# Patient Record
Sex: Male | Born: 1940
Health system: Southern US, Community
[De-identification: ages and names within clinical notes are randomized; demographics above are authoritative.]

## PROBLEM LIST (undated history)

## (undated) DIAGNOSIS — M1990 Unspecified osteoarthritis, unspecified site: Secondary | ICD-10-CM

## (undated) DIAGNOSIS — J189 Pneumonia, unspecified organism: Secondary | ICD-10-CM

## (undated) DIAGNOSIS — I10 Essential (primary) hypertension: Secondary | ICD-10-CM

## (undated) HISTORY — PX: COLONOSCOPY: SHX174

---

## 2015-09-09 DIAGNOSIS — I10 Essential (primary) hypertension: Secondary | ICD-10-CM | POA: Diagnosis not present

## 2015-09-09 DIAGNOSIS — N183 Chronic kidney disease, stage 3 (moderate): Secondary | ICD-10-CM | POA: Diagnosis not present

## 2015-09-17 DIAGNOSIS — I10 Essential (primary) hypertension: Secondary | ICD-10-CM | POA: Diagnosis not present

## 2015-09-17 DIAGNOSIS — N183 Chronic kidney disease, stage 3 (moderate): Secondary | ICD-10-CM | POA: Diagnosis not present

## 2015-10-16 DIAGNOSIS — N183 Chronic kidney disease, stage 3 (moderate): Secondary | ICD-10-CM | POA: Diagnosis not present

## 2015-10-16 DIAGNOSIS — I10 Essential (primary) hypertension: Secondary | ICD-10-CM | POA: Diagnosis not present

## 2015-10-28 DIAGNOSIS — I119 Hypertensive heart disease without heart failure: Secondary | ICD-10-CM | POA: Diagnosis not present

## 2015-10-28 DIAGNOSIS — E785 Hyperlipidemia, unspecified: Secondary | ICD-10-CM | POA: Diagnosis not present

## 2015-10-28 DIAGNOSIS — Z720 Tobacco use: Secondary | ICD-10-CM | POA: Diagnosis not present

## 2015-10-28 DIAGNOSIS — I739 Peripheral vascular disease, unspecified: Secondary | ICD-10-CM | POA: Diagnosis not present

## 2015-10-28 DIAGNOSIS — E559 Vitamin D deficiency, unspecified: Secondary | ICD-10-CM | POA: Diagnosis not present

## 2015-10-28 DIAGNOSIS — I10 Essential (primary) hypertension: Secondary | ICD-10-CM | POA: Diagnosis not present

## 2015-10-28 DIAGNOSIS — R7301 Impaired fasting glucose: Secondary | ICD-10-CM | POA: Diagnosis not present

## 2015-10-28 DIAGNOSIS — N183 Chronic kidney disease, stage 3 (moderate): Secondary | ICD-10-CM | POA: Diagnosis not present

## 2015-11-13 DIAGNOSIS — I10 Essential (primary) hypertension: Secondary | ICD-10-CM | POA: Diagnosis not present

## 2015-11-13 DIAGNOSIS — N183 Chronic kidney disease, stage 3 (moderate): Secondary | ICD-10-CM | POA: Diagnosis not present

## 2015-11-21 DIAGNOSIS — H11442 Conjunctival cysts, left eye: Secondary | ICD-10-CM | POA: Diagnosis not present

## 2015-12-22 DIAGNOSIS — I10 Essential (primary) hypertension: Secondary | ICD-10-CM | POA: Diagnosis not present

## 2015-12-22 DIAGNOSIS — N183 Chronic kidney disease, stage 3 (moderate): Secondary | ICD-10-CM | POA: Diagnosis not present

## 2016-01-26 DIAGNOSIS — Z136 Encounter for screening for cardiovascular disorders: Secondary | ICD-10-CM | POA: Diagnosis not present

## 2016-01-26 DIAGNOSIS — I10 Essential (primary) hypertension: Secondary | ICD-10-CM | POA: Diagnosis not present

## 2016-01-26 DIAGNOSIS — Z0000 Encounter for general adult medical examination without abnormal findings: Secondary | ICD-10-CM | POA: Diagnosis not present

## 2016-01-26 DIAGNOSIS — I739 Peripheral vascular disease, unspecified: Secondary | ICD-10-CM | POA: Diagnosis not present

## 2016-01-26 DIAGNOSIS — R7303 Prediabetes: Secondary | ICD-10-CM | POA: Diagnosis not present

## 2016-01-26 DIAGNOSIS — E559 Vitamin D deficiency, unspecified: Secondary | ICD-10-CM | POA: Diagnosis not present

## 2016-01-26 DIAGNOSIS — E785 Hyperlipidemia, unspecified: Secondary | ICD-10-CM | POA: Diagnosis not present

## 2016-01-26 DIAGNOSIS — Z1383 Encounter for screening for respiratory disorder NEC: Secondary | ICD-10-CM | POA: Diagnosis not present

## 2016-01-26 DIAGNOSIS — Z720 Tobacco use: Secondary | ICD-10-CM | POA: Diagnosis not present

## 2016-01-26 DIAGNOSIS — I119 Hypertensive heart disease without heart failure: Secondary | ICD-10-CM | POA: Diagnosis not present

## 2016-01-26 DIAGNOSIS — Z131 Encounter for screening for diabetes mellitus: Secondary | ICD-10-CM | POA: Diagnosis not present

## 2016-01-26 DIAGNOSIS — Z01118 Encounter for examination of ears and hearing with other abnormal findings: Secondary | ICD-10-CM | POA: Diagnosis not present

## 2016-01-26 DIAGNOSIS — N183 Chronic kidney disease, stage 3 (moderate): Secondary | ICD-10-CM | POA: Diagnosis not present

## 2016-02-04 DIAGNOSIS — I10 Essential (primary) hypertension: Secondary | ICD-10-CM | POA: Diagnosis not present

## 2016-02-04 DIAGNOSIS — N183 Chronic kidney disease, stage 3 (moderate): Secondary | ICD-10-CM | POA: Diagnosis not present

## 2016-02-12 DIAGNOSIS — N183 Chronic kidney disease, stage 3 (moderate): Secondary | ICD-10-CM | POA: Diagnosis not present

## 2016-02-12 DIAGNOSIS — I10 Essential (primary) hypertension: Secondary | ICD-10-CM | POA: Diagnosis not present

## 2016-03-26 DIAGNOSIS — I10 Essential (primary) hypertension: Secondary | ICD-10-CM | POA: Diagnosis not present

## 2016-03-26 DIAGNOSIS — E559 Vitamin D deficiency, unspecified: Secondary | ICD-10-CM | POA: Diagnosis not present

## 2016-03-26 DIAGNOSIS — E785 Hyperlipidemia, unspecified: Secondary | ICD-10-CM | POA: Diagnosis not present

## 2016-03-26 DIAGNOSIS — I739 Peripheral vascular disease, unspecified: Secondary | ICD-10-CM | POA: Diagnosis not present

## 2016-03-26 DIAGNOSIS — R7303 Prediabetes: Secondary | ICD-10-CM | POA: Diagnosis not present

## 2016-03-26 DIAGNOSIS — N183 Chronic kidney disease, stage 3 (moderate): Secondary | ICD-10-CM | POA: Diagnosis not present

## 2016-03-26 DIAGNOSIS — R1013 Epigastric pain: Secondary | ICD-10-CM | POA: Diagnosis not present

## 2016-03-26 DIAGNOSIS — Z0000 Encounter for general adult medical examination without abnormal findings: Secondary | ICD-10-CM | POA: Diagnosis not present

## 2016-03-26 DIAGNOSIS — I119 Hypertensive heart disease without heart failure: Secondary | ICD-10-CM | POA: Diagnosis not present

## 2016-03-26 DIAGNOSIS — Z720 Tobacco use: Secondary | ICD-10-CM | POA: Diagnosis not present

## 2016-04-09 DIAGNOSIS — N183 Chronic kidney disease, stage 3 (moderate): Secondary | ICD-10-CM | POA: Diagnosis not present

## 2016-04-09 DIAGNOSIS — I10 Essential (primary) hypertension: Secondary | ICD-10-CM | POA: Diagnosis not present

## 2016-04-30 DIAGNOSIS — I119 Hypertensive heart disease without heart failure: Secondary | ICD-10-CM | POA: Diagnosis not present

## 2016-04-30 DIAGNOSIS — I10 Essential (primary) hypertension: Secondary | ICD-10-CM | POA: Diagnosis not present

## 2016-04-30 DIAGNOSIS — Z0000 Encounter for general adult medical examination without abnormal findings: Secondary | ICD-10-CM | POA: Diagnosis not present

## 2016-04-30 DIAGNOSIS — N183 Chronic kidney disease, stage 3 (moderate): Secondary | ICD-10-CM | POA: Diagnosis not present

## 2016-04-30 DIAGNOSIS — E785 Hyperlipidemia, unspecified: Secondary | ICD-10-CM | POA: Diagnosis not present

## 2016-04-30 DIAGNOSIS — I739 Peripheral vascular disease, unspecified: Secondary | ICD-10-CM | POA: Diagnosis not present

## 2016-04-30 DIAGNOSIS — R7303 Prediabetes: Secondary | ICD-10-CM | POA: Diagnosis not present

## 2016-04-30 DIAGNOSIS — E559 Vitamin D deficiency, unspecified: Secondary | ICD-10-CM | POA: Diagnosis not present

## 2016-04-30 DIAGNOSIS — R1013 Epigastric pain: Secondary | ICD-10-CM | POA: Diagnosis not present

## 2016-04-30 DIAGNOSIS — Z720 Tobacco use: Secondary | ICD-10-CM | POA: Diagnosis not present

## 2016-05-07 DIAGNOSIS — I10 Essential (primary) hypertension: Secondary | ICD-10-CM | POA: Diagnosis not present

## 2016-05-07 DIAGNOSIS — N183 Chronic kidney disease, stage 3 (moderate): Secondary | ICD-10-CM | POA: Diagnosis not present

## 2016-05-17 DIAGNOSIS — N183 Chronic kidney disease, stage 3 (moderate): Secondary | ICD-10-CM | POA: Diagnosis not present

## 2016-05-17 DIAGNOSIS — I739 Peripheral vascular disease, unspecified: Secondary | ICD-10-CM | POA: Diagnosis not present

## 2016-05-17 DIAGNOSIS — Z720 Tobacco use: Secondary | ICD-10-CM | POA: Diagnosis not present

## 2016-05-17 DIAGNOSIS — E559 Vitamin D deficiency, unspecified: Secondary | ICD-10-CM | POA: Diagnosis not present

## 2016-05-17 DIAGNOSIS — I10 Essential (primary) hypertension: Secondary | ICD-10-CM | POA: Diagnosis not present

## 2016-05-17 DIAGNOSIS — E785 Hyperlipidemia, unspecified: Secondary | ICD-10-CM | POA: Diagnosis not present

## 2016-05-17 DIAGNOSIS — R1013 Epigastric pain: Secondary | ICD-10-CM | POA: Diagnosis not present

## 2016-05-17 DIAGNOSIS — I119 Hypertensive heart disease without heart failure: Secondary | ICD-10-CM | POA: Diagnosis not present

## 2016-05-17 DIAGNOSIS — R7303 Prediabetes: Secondary | ICD-10-CM | POA: Diagnosis not present

## 2016-06-04 DIAGNOSIS — I10 Essential (primary) hypertension: Secondary | ICD-10-CM | POA: Diagnosis not present

## 2016-06-04 DIAGNOSIS — N183 Chronic kidney disease, stage 3 (moderate): Secondary | ICD-10-CM | POA: Diagnosis not present

## 2016-06-09 DIAGNOSIS — N183 Chronic kidney disease, stage 3 (moderate): Secondary | ICD-10-CM | POA: Diagnosis not present

## 2016-06-09 DIAGNOSIS — H11442 Conjunctival cysts, left eye: Secondary | ICD-10-CM | POA: Diagnosis not present

## 2016-07-08 DIAGNOSIS — N183 Chronic kidney disease, stage 3 (moderate): Secondary | ICD-10-CM | POA: Diagnosis not present

## 2016-07-08 DIAGNOSIS — I10 Essential (primary) hypertension: Secondary | ICD-10-CM | POA: Diagnosis not present

## 2016-07-23 DIAGNOSIS — Z720 Tobacco use: Secondary | ICD-10-CM | POA: Diagnosis not present

## 2016-07-23 DIAGNOSIS — R1013 Epigastric pain: Secondary | ICD-10-CM | POA: Diagnosis not present

## 2016-07-23 DIAGNOSIS — I10 Essential (primary) hypertension: Secondary | ICD-10-CM | POA: Diagnosis not present

## 2016-07-23 DIAGNOSIS — I739 Peripheral vascular disease, unspecified: Secondary | ICD-10-CM | POA: Diagnosis not present

## 2016-07-23 DIAGNOSIS — I119 Hypertensive heart disease without heart failure: Secondary | ICD-10-CM | POA: Diagnosis not present

## 2016-07-23 DIAGNOSIS — N183 Chronic kidney disease, stage 3 (moderate): Secondary | ICD-10-CM | POA: Diagnosis not present

## 2016-07-23 DIAGNOSIS — E785 Hyperlipidemia, unspecified: Secondary | ICD-10-CM | POA: Diagnosis not present

## 2016-07-23 DIAGNOSIS — R7303 Prediabetes: Secondary | ICD-10-CM | POA: Diagnosis not present

## 2016-07-23 DIAGNOSIS — E559 Vitamin D deficiency, unspecified: Secondary | ICD-10-CM | POA: Diagnosis not present

## 2016-08-02 DIAGNOSIS — H11442 Conjunctival cysts, left eye: Secondary | ICD-10-CM | POA: Diagnosis not present

## 2016-08-18 DIAGNOSIS — R202 Paresthesia of skin: Secondary | ICD-10-CM | POA: Diagnosis not present

## 2016-08-31 DIAGNOSIS — G5621 Lesion of ulnar nerve, right upper limb: Secondary | ICD-10-CM | POA: Diagnosis not present

## 2016-09-15 DIAGNOSIS — M542 Cervicalgia: Secondary | ICD-10-CM | POA: Diagnosis not present

## 2016-09-15 DIAGNOSIS — M47812 Spondylosis without myelopathy or radiculopathy, cervical region: Secondary | ICD-10-CM | POA: Diagnosis not present

## 2016-09-15 DIAGNOSIS — M4312 Spondylolisthesis, cervical region: Secondary | ICD-10-CM | POA: Diagnosis not present

## 2016-09-23 DIAGNOSIS — I129 Hypertensive chronic kidney disease with stage 1 through stage 4 chronic kidney disease, or unspecified chronic kidney disease: Secondary | ICD-10-CM | POA: Diagnosis not present

## 2016-09-23 DIAGNOSIS — N183 Chronic kidney disease, stage 3 (moderate): Secondary | ICD-10-CM | POA: Diagnosis not present

## 2016-10-01 DIAGNOSIS — G5620 Lesion of ulnar nerve, unspecified upper limb: Secondary | ICD-10-CM | POA: Diagnosis not present

## 2016-10-01 DIAGNOSIS — M5030 Other cervical disc degeneration, unspecified cervical region: Secondary | ICD-10-CM | POA: Diagnosis not present

## 2016-10-18 DIAGNOSIS — I739 Peripheral vascular disease, unspecified: Secondary | ICD-10-CM | POA: Diagnosis not present

## 2016-10-18 DIAGNOSIS — N183 Chronic kidney disease, stage 3 (moderate): Secondary | ICD-10-CM | POA: Diagnosis not present

## 2016-10-18 DIAGNOSIS — I10 Essential (primary) hypertension: Secondary | ICD-10-CM | POA: Diagnosis not present

## 2016-10-18 DIAGNOSIS — I119 Hypertensive heart disease without heart failure: Secondary | ICD-10-CM | POA: Diagnosis not present

## 2016-10-18 DIAGNOSIS — Z720 Tobacco use: Secondary | ICD-10-CM | POA: Diagnosis not present

## 2016-10-18 DIAGNOSIS — Z0000 Encounter for general adult medical examination without abnormal findings: Secondary | ICD-10-CM | POA: Diagnosis not present

## 2016-10-18 DIAGNOSIS — Z01118 Encounter for examination of ears and hearing with other abnormal findings: Secondary | ICD-10-CM | POA: Diagnosis not present

## 2016-10-18 DIAGNOSIS — Z131 Encounter for screening for diabetes mellitus: Secondary | ICD-10-CM | POA: Diagnosis not present

## 2016-10-18 DIAGNOSIS — R1013 Epigastric pain: Secondary | ICD-10-CM | POA: Diagnosis not present

## 2016-10-18 DIAGNOSIS — E785 Hyperlipidemia, unspecified: Secondary | ICD-10-CM | POA: Diagnosis not present

## 2016-10-18 DIAGNOSIS — E559 Vitamin D deficiency, unspecified: Secondary | ICD-10-CM | POA: Diagnosis not present

## 2016-10-18 DIAGNOSIS — R7303 Prediabetes: Secondary | ICD-10-CM | POA: Diagnosis not present

## 2016-10-20 ENCOUNTER — Other Ambulatory Visit: Payer: Self-pay | Admitting: Nephrology

## 2016-10-20 DIAGNOSIS — N183 Chronic kidney disease, stage 3 unspecified: Secondary | ICD-10-CM

## 2016-10-27 ENCOUNTER — Other Ambulatory Visit: Payer: Self-pay

## 2016-12-31 DIAGNOSIS — G5620 Lesion of ulnar nerve, unspecified upper limb: Secondary | ICD-10-CM | POA: Diagnosis not present

## 2017-01-18 DIAGNOSIS — I739 Peripheral vascular disease, unspecified: Secondary | ICD-10-CM | POA: Diagnosis not present

## 2017-01-18 DIAGNOSIS — N183 Chronic kidney disease, stage 3 (moderate): Secondary | ICD-10-CM | POA: Diagnosis not present

## 2017-01-18 DIAGNOSIS — R1013 Epigastric pain: Secondary | ICD-10-CM | POA: Diagnosis not present

## 2017-01-18 DIAGNOSIS — D72819 Decreased white blood cell count, unspecified: Secondary | ICD-10-CM | POA: Diagnosis not present

## 2017-01-18 DIAGNOSIS — E785 Hyperlipidemia, unspecified: Secondary | ICD-10-CM | POA: Diagnosis not present

## 2017-01-18 DIAGNOSIS — E559 Vitamin D deficiency, unspecified: Secondary | ICD-10-CM | POA: Diagnosis not present

## 2017-01-18 DIAGNOSIS — Z720 Tobacco use: Secondary | ICD-10-CM | POA: Diagnosis not present

## 2017-01-18 DIAGNOSIS — R7303 Prediabetes: Secondary | ICD-10-CM | POA: Diagnosis not present

## 2017-01-18 DIAGNOSIS — I119 Hypertensive heart disease without heart failure: Secondary | ICD-10-CM | POA: Diagnosis not present

## 2017-01-18 DIAGNOSIS — I10 Essential (primary) hypertension: Secondary | ICD-10-CM | POA: Diagnosis not present

## 2017-07-06 DIAGNOSIS — I10 Essential (primary) hypertension: Secondary | ICD-10-CM | POA: Diagnosis not present

## 2017-07-06 DIAGNOSIS — I739 Peripheral vascular disease, unspecified: Secondary | ICD-10-CM | POA: Diagnosis not present

## 2017-07-20 DIAGNOSIS — I10 Essential (primary) hypertension: Secondary | ICD-10-CM | POA: Diagnosis not present

## 2017-07-20 DIAGNOSIS — I119 Hypertensive heart disease without heart failure: Secondary | ICD-10-CM | POA: Diagnosis not present

## 2017-08-30 DIAGNOSIS — Z720 Tobacco use: Secondary | ICD-10-CM | POA: Diagnosis not present

## 2017-08-30 DIAGNOSIS — R1013 Epigastric pain: Secondary | ICD-10-CM | POA: Diagnosis not present

## 2017-08-30 DIAGNOSIS — E559 Vitamin D deficiency, unspecified: Secondary | ICD-10-CM | POA: Diagnosis not present

## 2017-08-30 DIAGNOSIS — Z0000 Encounter for general adult medical examination without abnormal findings: Secondary | ICD-10-CM | POA: Diagnosis not present

## 2017-08-30 DIAGNOSIS — D72819 Decreased white blood cell count, unspecified: Secondary | ICD-10-CM | POA: Diagnosis not present

## 2017-08-30 DIAGNOSIS — E785 Hyperlipidemia, unspecified: Secondary | ICD-10-CM | POA: Diagnosis not present

## 2017-08-30 DIAGNOSIS — I119 Hypertensive heart disease without heart failure: Secondary | ICD-10-CM | POA: Diagnosis not present

## 2017-08-30 DIAGNOSIS — R7303 Prediabetes: Secondary | ICD-10-CM | POA: Diagnosis not present

## 2017-08-30 DIAGNOSIS — N183 Chronic kidney disease, stage 3 (moderate): Secondary | ICD-10-CM | POA: Diagnosis not present

## 2017-08-30 DIAGNOSIS — I10 Essential (primary) hypertension: Secondary | ICD-10-CM | POA: Diagnosis not present

## 2017-08-30 DIAGNOSIS — M25551 Pain in right hip: Secondary | ICD-10-CM | POA: Diagnosis not present

## 2017-08-30 DIAGNOSIS — I739 Peripheral vascular disease, unspecified: Secondary | ICD-10-CM | POA: Diagnosis not present

## 2017-08-31 DIAGNOSIS — N289 Disorder of kidney and ureter, unspecified: Secondary | ICD-10-CM | POA: Diagnosis not present

## 2017-08-31 DIAGNOSIS — I10 Essential (primary) hypertension: Secondary | ICD-10-CM | POA: Diagnosis not present

## 2017-09-14 DIAGNOSIS — I10 Essential (primary) hypertension: Secondary | ICD-10-CM | POA: Diagnosis not present

## 2017-09-14 DIAGNOSIS — E785 Hyperlipidemia, unspecified: Secondary | ICD-10-CM | POA: Diagnosis not present

## 2017-10-21 DIAGNOSIS — D72819 Decreased white blood cell count, unspecified: Secondary | ICD-10-CM | POA: Diagnosis not present

## 2017-10-21 DIAGNOSIS — Z0000 Encounter for general adult medical examination without abnormal findings: Secondary | ICD-10-CM | POA: Diagnosis not present

## 2017-10-21 DIAGNOSIS — Z01118 Encounter for examination of ears and hearing with other abnormal findings: Secondary | ICD-10-CM | POA: Diagnosis not present

## 2017-10-21 DIAGNOSIS — N183 Chronic kidney disease, stage 3 (moderate): Secondary | ICD-10-CM | POA: Diagnosis not present

## 2017-10-21 DIAGNOSIS — Z131 Encounter for screening for diabetes mellitus: Secondary | ICD-10-CM | POA: Diagnosis not present

## 2017-10-21 DIAGNOSIS — E559 Vitamin D deficiency, unspecified: Secondary | ICD-10-CM | POA: Diagnosis not present

## 2017-10-21 DIAGNOSIS — I739 Peripheral vascular disease, unspecified: Secondary | ICD-10-CM | POA: Diagnosis not present

## 2017-10-21 DIAGNOSIS — I119 Hypertensive heart disease without heart failure: Secondary | ICD-10-CM | POA: Diagnosis not present

## 2017-10-21 DIAGNOSIS — I10 Essential (primary) hypertension: Secondary | ICD-10-CM | POA: Diagnosis not present

## 2017-10-21 DIAGNOSIS — Z720 Tobacco use: Secondary | ICD-10-CM | POA: Diagnosis not present

## 2017-10-21 DIAGNOSIS — R1013 Epigastric pain: Secondary | ICD-10-CM | POA: Diagnosis not present

## 2017-10-21 DIAGNOSIS — R7303 Prediabetes: Secondary | ICD-10-CM | POA: Diagnosis not present

## 2017-10-21 DIAGNOSIS — E785 Hyperlipidemia, unspecified: Secondary | ICD-10-CM | POA: Diagnosis not present

## 2017-10-26 DIAGNOSIS — E785 Hyperlipidemia, unspecified: Secondary | ICD-10-CM | POA: Diagnosis not present

## 2017-10-26 DIAGNOSIS — I10 Essential (primary) hypertension: Secondary | ICD-10-CM | POA: Diagnosis not present

## 2017-11-30 DIAGNOSIS — I10 Essential (primary) hypertension: Secondary | ICD-10-CM | POA: Diagnosis not present

## 2017-11-30 DIAGNOSIS — E785 Hyperlipidemia, unspecified: Secondary | ICD-10-CM | POA: Diagnosis not present

## 2017-12-01 DIAGNOSIS — D72819 Decreased white blood cell count, unspecified: Secondary | ICD-10-CM | POA: Diagnosis not present

## 2017-12-01 DIAGNOSIS — M7918 Myalgia, other site: Secondary | ICD-10-CM | POA: Diagnosis not present

## 2017-12-01 DIAGNOSIS — N183 Chronic kidney disease, stage 3 (moderate): Secondary | ICD-10-CM | POA: Diagnosis not present

## 2017-12-01 DIAGNOSIS — I119 Hypertensive heart disease without heart failure: Secondary | ICD-10-CM | POA: Diagnosis not present

## 2017-12-01 DIAGNOSIS — I10 Essential (primary) hypertension: Secondary | ICD-10-CM | POA: Diagnosis not present

## 2017-12-01 DIAGNOSIS — E785 Hyperlipidemia, unspecified: Secondary | ICD-10-CM | POA: Diagnosis not present

## 2017-12-01 DIAGNOSIS — I739 Peripheral vascular disease, unspecified: Secondary | ICD-10-CM | POA: Diagnosis not present

## 2017-12-01 DIAGNOSIS — Z720 Tobacco use: Secondary | ICD-10-CM | POA: Diagnosis not present

## 2017-12-01 DIAGNOSIS — R1013 Epigastric pain: Secondary | ICD-10-CM | POA: Diagnosis not present

## 2017-12-01 DIAGNOSIS — E559 Vitamin D deficiency, unspecified: Secondary | ICD-10-CM | POA: Diagnosis not present

## 2017-12-01 DIAGNOSIS — R7303 Prediabetes: Secondary | ICD-10-CM | POA: Diagnosis not present

## 2017-12-19 DIAGNOSIS — N183 Chronic kidney disease, stage 3 (moderate): Secondary | ICD-10-CM | POA: Diagnosis not present

## 2017-12-19 DIAGNOSIS — I129 Hypertensive chronic kidney disease with stage 1 through stage 4 chronic kidney disease, or unspecified chronic kidney disease: Secondary | ICD-10-CM | POA: Diagnosis not present

## 2017-12-26 DIAGNOSIS — I739 Peripheral vascular disease, unspecified: Secondary | ICD-10-CM | POA: Diagnosis not present

## 2017-12-26 DIAGNOSIS — I10 Essential (primary) hypertension: Secondary | ICD-10-CM | POA: Diagnosis not present

## 2017-12-26 DIAGNOSIS — I119 Hypertensive heart disease without heart failure: Secondary | ICD-10-CM | POA: Diagnosis not present

## 2017-12-26 DIAGNOSIS — R1013 Epigastric pain: Secondary | ICD-10-CM | POA: Diagnosis not present

## 2017-12-26 DIAGNOSIS — E559 Vitamin D deficiency, unspecified: Secondary | ICD-10-CM | POA: Diagnosis not present

## 2017-12-26 DIAGNOSIS — M5417 Radiculopathy, lumbosacral region: Secondary | ICD-10-CM | POA: Diagnosis not present

## 2017-12-26 DIAGNOSIS — Z720 Tobacco use: Secondary | ICD-10-CM | POA: Diagnosis not present

## 2017-12-26 DIAGNOSIS — D72819 Decreased white blood cell count, unspecified: Secondary | ICD-10-CM | POA: Diagnosis not present

## 2017-12-26 DIAGNOSIS — N183 Chronic kidney disease, stage 3 (moderate): Secondary | ICD-10-CM | POA: Diagnosis not present

## 2017-12-26 DIAGNOSIS — R7303 Prediabetes: Secondary | ICD-10-CM | POA: Diagnosis not present

## 2017-12-26 DIAGNOSIS — E785 Hyperlipidemia, unspecified: Secondary | ICD-10-CM | POA: Diagnosis not present

## 2018-01-09 DIAGNOSIS — D72819 Decreased white blood cell count, unspecified: Secondary | ICD-10-CM | POA: Diagnosis not present

## 2018-01-09 DIAGNOSIS — Z720 Tobacco use: Secondary | ICD-10-CM | POA: Diagnosis not present

## 2018-01-09 DIAGNOSIS — M25551 Pain in right hip: Secondary | ICD-10-CM | POA: Diagnosis not present

## 2018-01-09 DIAGNOSIS — I739 Peripheral vascular disease, unspecified: Secondary | ICD-10-CM | POA: Diagnosis not present

## 2018-01-09 DIAGNOSIS — R7303 Prediabetes: Secondary | ICD-10-CM | POA: Diagnosis not present

## 2018-01-09 DIAGNOSIS — I10 Essential (primary) hypertension: Secondary | ICD-10-CM | POA: Diagnosis not present

## 2018-01-09 DIAGNOSIS — E785 Hyperlipidemia, unspecified: Secondary | ICD-10-CM | POA: Diagnosis not present

## 2018-01-09 DIAGNOSIS — I119 Hypertensive heart disease without heart failure: Secondary | ICD-10-CM | POA: Diagnosis not present

## 2018-01-09 DIAGNOSIS — E559 Vitamin D deficiency, unspecified: Secondary | ICD-10-CM | POA: Diagnosis not present

## 2018-01-09 DIAGNOSIS — M7918 Myalgia, other site: Secondary | ICD-10-CM | POA: Diagnosis not present

## 2018-01-09 DIAGNOSIS — N183 Chronic kidney disease, stage 3 (moderate): Secondary | ICD-10-CM | POA: Diagnosis not present

## 2018-01-09 DIAGNOSIS — R1013 Epigastric pain: Secondary | ICD-10-CM | POA: Diagnosis not present

## 2018-01-30 DIAGNOSIS — I10 Essential (primary) hypertension: Secondary | ICD-10-CM | POA: Diagnosis not present

## 2018-01-30 DIAGNOSIS — R1013 Epigastric pain: Secondary | ICD-10-CM | POA: Diagnosis not present

## 2018-01-30 DIAGNOSIS — I119 Hypertensive heart disease without heart failure: Secondary | ICD-10-CM | POA: Diagnosis not present

## 2018-01-30 DIAGNOSIS — E785 Hyperlipidemia, unspecified: Secondary | ICD-10-CM | POA: Diagnosis not present

## 2018-01-30 DIAGNOSIS — N183 Chronic kidney disease, stage 3 (moderate): Secondary | ICD-10-CM | POA: Diagnosis not present

## 2018-01-30 DIAGNOSIS — Z720 Tobacco use: Secondary | ICD-10-CM | POA: Diagnosis not present

## 2018-01-30 DIAGNOSIS — E559 Vitamin D deficiency, unspecified: Secondary | ICD-10-CM | POA: Diagnosis not present

## 2018-01-30 DIAGNOSIS — D72819 Decreased white blood cell count, unspecified: Secondary | ICD-10-CM | POA: Diagnosis not present

## 2018-01-30 DIAGNOSIS — I739 Peripheral vascular disease, unspecified: Secondary | ICD-10-CM | POA: Diagnosis not present

## 2018-01-30 DIAGNOSIS — M7918 Myalgia, other site: Secondary | ICD-10-CM | POA: Diagnosis not present

## 2018-01-30 DIAGNOSIS — M25551 Pain in right hip: Secondary | ICD-10-CM | POA: Diagnosis not present

## 2018-01-30 DIAGNOSIS — R7303 Prediabetes: Secondary | ICD-10-CM | POA: Diagnosis not present

## 2018-01-31 ENCOUNTER — Ambulatory Visit (INDEPENDENT_AMBULATORY_CARE_PROVIDER_SITE_OTHER): Payer: PPO | Admitting: Orthopaedic Surgery

## 2018-01-31 ENCOUNTER — Ambulatory Visit (INDEPENDENT_AMBULATORY_CARE_PROVIDER_SITE_OTHER): Payer: PPO

## 2018-01-31 ENCOUNTER — Encounter (INDEPENDENT_AMBULATORY_CARE_PROVIDER_SITE_OTHER): Payer: Self-pay | Admitting: Orthopedic Surgery

## 2018-01-31 VITALS — Ht 66.0 in | Wt 157.0 lb

## 2018-01-31 DIAGNOSIS — M1611 Unilateral primary osteoarthritis, right hip: Secondary | ICD-10-CM

## 2018-01-31 NOTE — Progress Notes (Signed)
   Office Visit Note   Patient: Jeffrey Shannon           Date of Birth: 1941/03/21           MRN: [id number] Visit Date: 01/31/2018              Requested by: Benito Mccreedy, MD [address] [place], [place] [address] PCP: Benito Mccreedy, MD   Assessment & Plan: Visit Diagnoses:  1. Unilateral primary osteoarthritis, right hip     Plan: Impression 77 year old male with end-stage right hip degenerative joint disease.  We discussed conservative versus surgical treatment options and their associated risks and benefits.  He would like to try a cortisone injection to see what kind of relief he gets.  He understands that given the severity of his degenerative disease he may not get the relief that he wants.  I would like to recheck him in 6 weeks to see how he responds to the injection.  Follow-Up Instructions: Return in about 6 weeks (around 03/14/2018).   Orders:  Orders Placed This Encounter  Procedures  . XR HIP UNILAT W OR W/O PELVIS 2-3 VIEWS RIGHT   No orders of the defined types were placed in this encounter.     Procedures: No procedures performed   Clinical Data: No additional findings.   Subjective: Chief Complaint  Patient presents with  . Right Leg - Pain  . Left Leg - Pain    Patient is a 77 year old gentleman 85-month history of right hip and groin pain and thigh pain.  Denies any injuries.  He continues to work as a Actor.  Denies a history of diabetes.  Pain is ambulation and activity and prolonged standing.   Review of Systems  Constitutional: Negative.   All other systems reviewed and are negative.    Objective: Vital Signs: Ht 5\' 6"  (1.676 m)   Wt 157 lb (71.2 kg)   BMI 25.34 kg/m   Physical Exam  Constitutional: He is oriented to person, place, and time. He appears well-developed and well-nourished.  HENT:  Head: Normocephalic and atraumatic.  Eyes: Pupils are equal, round, and reactive to light.  Neck: Neck supple.    Pulmonary/Chest: Effort normal.  Abdominal: Soft.  Musculoskeletal: Normal range of motion.  Neurological: He is alert and oriented to person, place, and time.  Skin: Skin is warm.  Psychiatric: He has a normal mood and affect. His behavior is normal. Judgment and thought content normal.  Nursing note and vitals reviewed.   Ortho Exam Patient walks with a limp.  He has positive FADIR and positive Stinchfield sign.  Negative sciatic tension signs.  Limited internal and external rotation. Specialty Comments:  No specialty comments available.  Imaging: Xr Hip Unilat W Or W/o Pelvis 2-3 Views Right  Result Date: 01/31/2018 Advanced right hip degenerative joint disease with bone-on-bone joint space narrowing    PMFS History: There are no active problems to display for this patient.  History reviewed. No pertinent past medical history.  History reviewed. No pertinent family history.  History reviewed. No pertinent surgical history. Social History   Occupational History  . Not on file  Tobacco Use  . Smoking status: Never Smoker  . Smokeless tobacco: Never Used  Substance and Sexual Activity  . Alcohol use: Not on file  . Drug use: Not on file  . Sexual activity: Not on file

## 2018-02-15 ENCOUNTER — Telehealth (INDEPENDENT_AMBULATORY_CARE_PROVIDER_SITE_OTHER): Payer: Self-pay | Admitting: Orthopaedic Surgery

## 2018-02-15 NOTE — Telephone Encounter (Signed)
See message can he come in sooner then sept 3rd

## 2018-02-15 NOTE — Telephone Encounter (Signed)
Patient called would like to know when he can come in for cortisone injection. Patient stated he is in a lot of pain and would like to come in soon if possible. Patient is sched for a 6 week follow up on the 3rd of September. Please call patient to discuss patient care.

## 2018-02-16 ENCOUNTER — Other Ambulatory Visit (INDEPENDENT_AMBULATORY_CARE_PROVIDER_SITE_OTHER): Payer: Self-pay

## 2018-02-16 DIAGNOSIS — M25551 Pain in right hip: Secondary | ICD-10-CM

## 2018-02-16 NOTE — Telephone Encounter (Signed)
Referral made for inj with Dr Ernestina Patches. Patient aware. Okay per Mendel Ryder.

## 2018-02-16 NOTE — Telephone Encounter (Signed)
Is this for the hip?  As long as it has been 3 months since previous injection it should be fine, but this needs to be coordinated with [place] team as he does the shots, not [place]

## 2018-03-02 ENCOUNTER — Ambulatory Visit (INDEPENDENT_AMBULATORY_CARE_PROVIDER_SITE_OTHER): Payer: Self-pay

## 2018-03-02 ENCOUNTER — Ambulatory Visit (INDEPENDENT_AMBULATORY_CARE_PROVIDER_SITE_OTHER): Payer: PPO | Admitting: Physical Medicine and Rehabilitation

## 2018-03-02 ENCOUNTER — Encounter (INDEPENDENT_AMBULATORY_CARE_PROVIDER_SITE_OTHER): Payer: Self-pay | Admitting: Physical Medicine and Rehabilitation

## 2018-03-02 DIAGNOSIS — M25551 Pain in right hip: Secondary | ICD-10-CM

## 2018-03-02 NOTE — Progress Notes (Signed)
 .  Numeric Pain Rating Scale and Functional Assessment Average Pain 8   In the last MONTH (on 0-10 scale) has pain interfered with the following?  1. General activity like being  able to carry out your everyday physical activities such as walking, climbing stairs, carrying groceries, or moving a chair?  Rating(5)    -Dye Allergies.  

## 2018-03-02 NOTE — Patient Instructions (Signed)

## 2018-03-02 NOTE — Progress Notes (Signed)
   Jeffrey Shannon - 77 y.o. male MRN [id number]  Date of birth: [birth year]  Office Visit Note: Visit Date: 03/02/2018 PCP: Benito Mccreedy, MD Referred by: Benito Mccreedy, MD  Subjective: Chief Complaint  Patient presents with  . Right Hip - Pain   HPI: Mr. Down is a 77 year old gentleman that comes in today at the request of Tawanna Cooler, PA-C for diagnostic and hopefully therapeutic right hip anesthetic arthrogram.  Patient has been having 3 months of severe right hip and groin pain worse with walking and bending.   ROS Otherwise per HPI.  Assessment & Plan: Visit Diagnoses:  1. Pain in right hip     Plan: Findings:  Diagnostic note for therapeutic anesthetic hip arthrogram on the right.  Patient did have relief during the anesthetic phase.    Meds & Orders: No orders of the defined types were placed in this encounter.   Orders Placed This Encounter  Procedures  . Large Joint Inj: R hip joint  . XR C-ARM NO REPORT    Follow-up: Return if symptoms worsen or fail to improve, for Dr. Erlinda Hong.   Procedures: Large Joint Inj: R hip joint on 03/02/2018 3:10 PM Indications: pain and diagnostic evaluation Details: 22 G needle, anterior approach  Arthrogram: Yes  Medications: 80 mg triamcinolone acetonide 40 MG/ML; 3 mL bupivacaine 0.5 % Outcome: tolerated well, no immediate complications  Arthrogram demonstrated excellent flow of contrast throughout the joint surface without extravasation or obvious defect.  The patient had relief of symptoms during the anesthetic phase of the injection.  Procedure, treatment alternatives, risks and benefits explained, specific risks discussed. Consent was given by the patient. Immediately prior to procedure a time out was called to verify the correct patient, procedure, equipment, support staff and site/side marked as required. Patient was prepped and draped in the usual sterile fashion.      No notes on file   Clinical History: No  specialty comments available.   He reports that he has never smoked. He has never used smokeless tobacco. No results for input(s): HGBA1C, LABURIC in the last 8760 hours.  Objective:  VS:  HT:    WT:   BMI:     BP:   HR: bpm  TEMP: ( )  RESP:  Physical Exam  Ortho Exam Imaging: No results found.  Past Medical/Family/Surgical/Social History: Medications & Allergies reviewed per EMR, new medications updated. There are no active problems to display for this patient.  History reviewed. No pertinent past medical history. History reviewed. No pertinent family history. History reviewed. No pertinent surgical history. Social History   Occupational History  . Not on file  Tobacco Use  . Smoking status: Never Smoker  . Smokeless tobacco: Never Used  Substance and Sexual Activity  . Alcohol use: Not on file  . Drug use: Not on file  . Sexual activity: Not on file

## 2018-03-14 ENCOUNTER — Ambulatory Visit (INDEPENDENT_AMBULATORY_CARE_PROVIDER_SITE_OTHER): Payer: PPO | Admitting: Orthopaedic Surgery

## 2018-03-14 ENCOUNTER — Encounter (INDEPENDENT_AMBULATORY_CARE_PROVIDER_SITE_OTHER): Payer: Self-pay | Admitting: Orthopaedic Surgery

## 2018-03-14 DIAGNOSIS — M1611 Unilateral primary osteoarthritis, right hip: Secondary | ICD-10-CM

## 2018-03-14 NOTE — Progress Notes (Signed)
   Office Visit Note   Patient: Jeffrey Shannon           Date of Birth: 1941/05/29           MRN: [id number] Visit Date: 03/14/2018              Requested by: Benito Mccreedy, MD [address] [place], [place] [address] PCP: Benito Mccreedy, MD   Assessment & Plan: Visit Diagnoses:  1. Unilateral primary osteoarthritis, right hip     Plan: Right hip pain secondary to severe right hip arthritis.  He had minimal relief from steroid injection.  With his advanced degeneration the next option would be hip replacement in order to achieve any meaningful pain relief.  Discussed this today however the patient is not interested at this time as the pain is tolerable enough.  He will follow-up as needed.  Follow-Up Instructions: Return if symptoms worsen or fail to improve.   Orders:  No orders of the defined types were placed in this encounter.  No orders of the defined types were placed in this encounter.     Procedures: No procedures performed   Clinical Data: No additional findings.   Subjective: Chief Complaint  Patient presents with  . Right Hip - Follow-up    HPI  77 year old male here for follow-up for right hip arthritis.  03/02/2018 he underwent a right hip intra-articular steroid injection.  He reports roughly 25% improvement in his pain.  Notably, he states he is no longer in constant pain and only experiences pain if he turns or pivots on his right leg.  He continues to have morning stiffness and pain.  Reports pain rating toward the groin and into the thigh.  He denies any numbness or tingling in the lower extremities.  Denies any back pain.  In addition to the recent steroid injection he was previously using hydrocodone and gabapentin for pain control which she has since discontinued.  No associated skin changes.  Review of Systems   Objective: Vital Signs: There were no vitals taken for this visit.  Physical Exam  GEN: Awake, alert, no acute  distress Pulmonary: Breathing unlabored  Ortho Exam  Right hip:  - Inspection: No gross deformity - Palpation: Mild tenderness anteriorly over the hip flexors.  No tenderness over the greater trochanter - ROM: Normal range of motion on Flexion, extension, abduction. decreased internal rotation with discomfort.  Normal external rotation - Strength: Normal strength. - Special Tests: Negative FABER positive FADIR.  Pain with Stinchfield  Specialty Comments:  No specialty comments available.  Imaging: No results found.   PMFS History: There are no active problems to display for this patient.  History reviewed. No pertinent past medical history.  History reviewed. No pertinent family history.  History reviewed. No pertinent surgical history. Social History   Occupational History  . Not on file  Tobacco Use  . Smoking status: Never Smoker  . Smokeless tobacco: Never Used  Substance and Sexual Activity  . Alcohol use: Not on file  . Drug use: Not on file  . Sexual activity: Not on file

## 2018-03-17 MED ORDER — BUPIVACAINE HCL 0.5 % IJ SOLN
3.0000 mL | INTRAMUSCULAR | Status: AC | PRN
  Administered 2018-03-02: 3 mL via INTRA_ARTICULAR

## 2018-03-17 MED ORDER — TRIAMCINOLONE ACETONIDE 40 MG/ML IJ SUSP
80.0000 mg | INTRAMUSCULAR | Status: AC | PRN
  Administered 2018-03-02: 80 mg via INTRA_ARTICULAR

## 2018-05-16 DIAGNOSIS — N183 Chronic kidney disease, stage 3 (moderate): Secondary | ICD-10-CM | POA: Diagnosis not present

## 2018-05-16 DIAGNOSIS — Z720 Tobacco use: Secondary | ICD-10-CM | POA: Diagnosis not present

## 2018-05-16 DIAGNOSIS — D72819 Decreased white blood cell count, unspecified: Secondary | ICD-10-CM | POA: Diagnosis not present

## 2018-05-16 DIAGNOSIS — I119 Hypertensive heart disease without heart failure: Secondary | ICD-10-CM | POA: Diagnosis not present

## 2018-05-16 DIAGNOSIS — R7303 Prediabetes: Secondary | ICD-10-CM | POA: Diagnosis not present

## 2018-05-16 DIAGNOSIS — E785 Hyperlipidemia, unspecified: Secondary | ICD-10-CM | POA: Diagnosis not present

## 2018-05-16 DIAGNOSIS — R1013 Epigastric pain: Secondary | ICD-10-CM | POA: Diagnosis not present

## 2018-05-16 DIAGNOSIS — Z23 Encounter for immunization: Secondary | ICD-10-CM | POA: Diagnosis not present

## 2018-05-16 DIAGNOSIS — I10 Essential (primary) hypertension: Secondary | ICD-10-CM | POA: Diagnosis not present

## 2018-05-16 DIAGNOSIS — I739 Peripheral vascular disease, unspecified: Secondary | ICD-10-CM | POA: Diagnosis not present

## 2018-05-16 DIAGNOSIS — Z131 Encounter for screening for diabetes mellitus: Secondary | ICD-10-CM | POA: Diagnosis not present

## 2018-05-16 DIAGNOSIS — E559 Vitamin D deficiency, unspecified: Secondary | ICD-10-CM | POA: Diagnosis not present

## 2018-07-31 DIAGNOSIS — R7303 Prediabetes: Secondary | ICD-10-CM | POA: Diagnosis not present

## 2018-07-31 DIAGNOSIS — D72819 Decreased white blood cell count, unspecified: Secondary | ICD-10-CM | POA: Diagnosis not present

## 2018-07-31 DIAGNOSIS — R1013 Epigastric pain: Secondary | ICD-10-CM | POA: Diagnosis not present

## 2018-07-31 DIAGNOSIS — Z1329 Encounter for screening for other suspected endocrine disorder: Secondary | ICD-10-CM | POA: Diagnosis not present

## 2018-07-31 DIAGNOSIS — Z720 Tobacco use: Secondary | ICD-10-CM | POA: Diagnosis not present

## 2018-07-31 DIAGNOSIS — E7800 Pure hypercholesterolemia, unspecified: Secondary | ICD-10-CM | POA: Diagnosis not present

## 2018-07-31 DIAGNOSIS — I10 Essential (primary) hypertension: Secondary | ICD-10-CM | POA: Diagnosis not present

## 2018-07-31 DIAGNOSIS — R062 Wheezing: Secondary | ICD-10-CM | POA: Diagnosis not present

## 2018-07-31 DIAGNOSIS — J189 Pneumonia, unspecified organism: Secondary | ICD-10-CM | POA: Diagnosis not present

## 2018-07-31 DIAGNOSIS — Z131 Encounter for screening for diabetes mellitus: Secondary | ICD-10-CM | POA: Diagnosis not present

## 2018-07-31 DIAGNOSIS — E559 Vitamin D deficiency, unspecified: Secondary | ICD-10-CM | POA: Diagnosis not present

## 2018-07-31 DIAGNOSIS — N183 Chronic kidney disease, stage 3 (moderate): Secondary | ICD-10-CM | POA: Diagnosis not present

## 2018-07-31 DIAGNOSIS — I119 Hypertensive heart disease without heart failure: Secondary | ICD-10-CM | POA: Diagnosis not present

## 2018-08-21 DIAGNOSIS — I129 Hypertensive chronic kidney disease with stage 1 through stage 4 chronic kidney disease, or unspecified chronic kidney disease: Secondary | ICD-10-CM | POA: Diagnosis not present

## 2018-08-21 DIAGNOSIS — N183 Chronic kidney disease, stage 3 (moderate): Secondary | ICD-10-CM | POA: Diagnosis not present

## 2018-08-22 ENCOUNTER — Ambulatory Visit (INDEPENDENT_AMBULATORY_CARE_PROVIDER_SITE_OTHER): Payer: PPO

## 2018-08-22 ENCOUNTER — Ambulatory Visit (INDEPENDENT_AMBULATORY_CARE_PROVIDER_SITE_OTHER): Payer: PPO | Admitting: Orthopaedic Surgery

## 2018-08-22 DIAGNOSIS — M1611 Unilateral primary osteoarthritis, right hip: Secondary | ICD-10-CM | POA: Diagnosis not present

## 2018-08-22 NOTE — Progress Notes (Signed)
Office Visit Note   Patient: Jeffrey Shannon           Date of Birth: Jan 24, 1941           MRN: [id number] Visit Date: 08/22/2018              Requested by: Jeffrey Mccreedy, MD [address] [place], [place] [address] PCP: Jeffrey Mccreedy, MD   Assessment & Plan: Visit Diagnoses:  1. Unilateral primary osteoarthritis, right hip     Plan: Impression is end-stage right hip degenerative joint disease now with significant clinical worsening and difficulty with ADLs.  At this point patient wishes to pursue a right total hip replacement after discussion of risks and benefits and rehab and recovery.  We will obtain PCP clearance first from Dr. Orma Shannon.  He denies a history of DVT or aspirin allergy.  All questions answered to his satisfaction.  Follow-Up Instructions: Return for 2 week post op visit .   Orders:  Orders Placed This Encounter  Procedures  . XR HIP UNILAT W OR W/O PELVIS 2-3 VIEWS RIGHT   No orders of the defined types were placed in this encounter.     Procedures: No procedures performed   Clinical Data: No additional findings.   Subjective: Chief Complaint  Patient presents with  . Right Hip - Follow-up    Jeffrey Shannon is a 78 year old gentleman who comes in for follow-up of his right hip degenerative joint disease.  He now has severe difficulty with ADLs and has recently gotten worsening pain to where he has trouble walking.  He denies any back pain.  He reports deep-seated right hip pain.   Review of Systems  Constitutional: Negative.   All other systems reviewed and are negative.    Objective: Vital Signs: There were no vitals taken for this visit.  Physical Exam Vitals signs and nursing note reviewed.  Constitutional:      Appearance: He is well-developed.  HENT:     Head: Normocephalic and atraumatic.  Eyes:     Pupils: Pupils are equal, round, and reactive to light.  Neck:     Musculoskeletal: Neck supple.  Pulmonary:   Effort: Pulmonary effort is normal.  Abdominal:     Palpations: Abdomen is soft.  Musculoskeletal: Normal range of motion.  Skin:    General: Skin is warm.  Neurological:     Mental Status: He is alert and oriented to person, place, and time.  Psychiatric:        Behavior: Behavior normal.        Thought Content: Thought content normal.        Judgment: Judgment normal.     Ortho Exam Right hip exam shows positive FADIR.  Positive logroll.  Positive Stinchfield sign. Specialty Comments:  No specialty comments available.  Imaging: Xr Hip Unilat W Or W/o Pelvis 2-3 Views Right  Result Date: 08/22/2018 Advanced right hip degenerative joint disease with bone-on-bone joint space narrowing    PMFS History: There are no active problems to display for this patient.  No past medical history on file.  No family history on file.  No past surgical history on file. Social History   Occupational History  . Not on file  Tobacco Use  . Smoking status: Never Smoker  . Smokeless tobacco: Never Used  Substance and Sexual Activity  . Alcohol use: Not on file  . Drug use: Not on file  . Sexual activity: Not on file

## 2018-08-30 DIAGNOSIS — E7800 Pure hypercholesterolemia, unspecified: Secondary | ICD-10-CM | POA: Diagnosis not present

## 2018-08-30 DIAGNOSIS — I10 Essential (primary) hypertension: Secondary | ICD-10-CM | POA: Diagnosis not present

## 2018-08-30 DIAGNOSIS — E559 Vitamin D deficiency, unspecified: Secondary | ICD-10-CM | POA: Diagnosis not present

## 2018-08-30 DIAGNOSIS — Z720 Tobacco use: Secondary | ICD-10-CM | POA: Diagnosis not present

## 2018-08-30 DIAGNOSIS — R7303 Prediabetes: Secondary | ICD-10-CM | POA: Diagnosis not present

## 2018-08-30 DIAGNOSIS — Z136 Encounter for screening for cardiovascular disorders: Secondary | ICD-10-CM | POA: Diagnosis not present

## 2018-08-30 DIAGNOSIS — R1013 Epigastric pain: Secondary | ICD-10-CM | POA: Diagnosis not present

## 2018-08-30 DIAGNOSIS — I119 Hypertensive heart disease without heart failure: Secondary | ICD-10-CM | POA: Diagnosis not present

## 2018-08-30 DIAGNOSIS — N183 Chronic kidney disease, stage 3 (moderate): Secondary | ICD-10-CM | POA: Diagnosis not present

## 2018-08-30 DIAGNOSIS — Z0001 Encounter for general adult medical examination with abnormal findings: Secondary | ICD-10-CM | POA: Diagnosis not present

## 2018-09-06 NOTE — Pre-Procedure Instructions (Signed)
[place]  09/06/2018      [id number] [place], [place]. [address] [place] [address] Phone: [phone] Fax: [phone]    Your procedure is scheduled on March 9th.  Report to [facility] Entrance "A" Admitting at 8:00 A.M.  Call this number if you have problems the morning of surgery:  [phone]   Remember:  Do not eat or drink after midnight.     Take these medicines the morning of surgery with A SIP OF WATER   Amlodipine (Norvasc)      Do not wear jewelry.  Do not wear lotions, powders, colognes, or deodorant.  Do not shave 48 hours prior to surgery.  Men may shave face and neck.  Do not bring valuables to the hospital.  [facility] is not responsible for any belongings or valuables.   [facility]- Preparing For Surgery  Before surgery, you can play an important role. Because skin is not sterile, your skin needs to be as free of germs as possible. You can reduce the number of germs on your skin by washing with CHG (chlorahexidine gluconate) Soap before surgery.  CHG is an antiseptic cleaner which kills germs and bonds with the skin to continue killing germs even after washing.    Oral Hygiene is also important to reduce your risk of infection.  Remember - BRUSH YOUR TEETH THE MORNING OF SURGERY WITH YOUR REGULAR TOOTHPASTE  Please do not use if you have an allergy to CHG or antibacterial soaps. If your skin becomes reddened/irritated stop using the CHG.  Do not shave (including legs and underarms) for at least 48 hours prior to first CHG shower. It is OK to shave your face.  Please follow these instructions carefully.   1. Shower the NIGHT BEFORE SURGERY and the MORNING OF SURGERY with CHG.   2. If you chose to wash your hair, wash your hair first as usual with your normal shampoo.  3. After you shampoo, rinse your hair and body thoroughly to remove the shampoo.  4. Use CHG as you would  any other liquid soap. You can apply CHG directly to the skin and wash gently with a scrungie or a clean washcloth.   5. Apply the CHG Soap to your body ONLY FROM THE NECK DOWN.  Do not use on open wounds or open sores. Avoid contact with your eyes, ears, mouth and genitals (private parts). Wash Face and genitals (private parts)  with your normal soap.  6. Wash thoroughly, paying special attention to the area where your surgery will be performed.  7. Thoroughly rinse your body with warm water from the neck down.  8. DO NOT shower/wash with your normal soap after using and rinsing off the CHG Soap.  9. Pat yourself dry with a CLEAN TOWEL.  10. Wear CLEAN PAJAMAS to bed the night before surgery, wear comfortable clothes the morning of surgery  11. Place CLEAN SHEETS on your bed the night of your first shower and DO NOT SLEEP WITH PETS.   Day of Surgery:  Do not apply any deodorants/lotions.  Please wear clean clothes to the hospital/surgery center.   Remember to brush your teeth WITH YOUR REGULAR TOOTHPASTE.   Contacts, dentures or bridgework may not be worn into surgery.  Leave your suitcase in the car.  After surgery it may be brought to your room.  For patients admitted to the hospital, discharge  time will be determined by your treatment team.  Patients discharged the day of surgery will not be allowed to drive home.    Please read over the following fact sheets that you were given. Coughing and Deep Breathing, MRSA Information and Surgical Site Infection Prevention

## 2018-09-07 ENCOUNTER — Other Ambulatory Visit: Payer: Self-pay

## 2018-09-07 ENCOUNTER — Encounter (HOSPITAL_COMMUNITY)
Admission: RE | Admit: 2018-09-07 | Discharge: 2018-09-07 | Disposition: A | Discharge status: Home / Self Care | Payer: PPO | Source: Ambulatory Visit | Attending: Orthopaedic Surgery | Admitting: Orthopaedic Surgery

## 2018-09-07 ENCOUNTER — Encounter (HOSPITAL_COMMUNITY): Payer: Self-pay

## 2018-09-07 DIAGNOSIS — Z01812 Encounter for preprocedural laboratory examination: Secondary | ICD-10-CM | POA: Diagnosis not present

## 2018-09-07 HISTORY — DX: Unspecified osteoarthritis, unspecified site: M19.90

## 2018-09-07 HISTORY — DX: Pneumonia, unspecified organism: J18.9

## 2018-09-07 HISTORY — DX: Essential (primary) hypertension: I10

## 2018-09-07 LAB — CBC WITH DIFFERENTIAL/PLATELET
Abs Immature Granulocytes: 0.01 10*3/uL (ref 0.00–0.07)
Basophils Absolute: 0 10*3/uL (ref 0.0–0.1)
Basophils Relative: 1 %
Eosinophils Absolute: 0.1 10*3/uL (ref 0.0–0.5)
Eosinophils Relative: 3 %
HCT: 46 % (ref 39.0–52.0)
Hemoglobin: 14.6 g/dL (ref 13.0–17.0)
Immature Granulocytes: 0 %
Lymphocytes Relative: 42 %
Lymphs Abs: 1.5 10*3/uL (ref 0.7–4.0)
MCH: 32.4 pg (ref 26.0–34.0)
MCHC: 31.7 g/dL (ref 30.0–36.0)
MCV: 102 fL — ABNORMAL HIGH (ref 80.0–100.0)
Monocytes Absolute: 0.6 10*3/uL (ref 0.1–1.0)
Monocytes Relative: 17 %
Neutro Abs: 1.3 10*3/uL — ABNORMAL LOW (ref 1.7–7.7)
Neutrophils Relative %: 37 %
Platelets: 192 10*3/uL (ref 150–400)
RBC: 4.51 MIL/uL (ref 4.22–5.81)
RDW: 12.3 % (ref 11.5–15.5)
WBC: 3.5 10*3/uL — ABNORMAL LOW (ref 4.0–10.5)
nRBC: 0 % (ref 0.0–0.2)

## 2018-09-07 LAB — COMPREHENSIVE METABOLIC PANEL
ALT: 15 U/L (ref 0–44)
AST: 22 U/L (ref 15–41)
Albumin: 3.9 g/dL (ref 3.5–5.0)
Alkaline Phosphatase: 65 U/L (ref 38–126)
Anion gap: 8 (ref 5–15)
BILIRUBIN TOTAL: 0.8 mg/dL (ref 0.3–1.2)
BUN: 17 mg/dL (ref 8–23)
CO2: 25 mmol/L (ref 22–32)
Calcium: 9.5 mg/dL (ref 8.9–10.3)
Chloride: 110 mmol/L (ref 98–111)
Creatinine, Ser: 1.33 mg/dL — ABNORMAL HIGH (ref 0.61–1.24)
GFR calc Af Amer: >60 mL/min (ref >60)
GFR calc non Af Amer: 52 mL/min — ABNORMAL LOW (ref >60)
Glucose, Bld: 91 mg/dL (ref 70–99)
Potassium: 4.3 mmol/L (ref 3.5–5.1)
Sodium: 143 mmol/L (ref 135–145)
TOTAL PROTEIN: 7.4 g/dL (ref 6.5–8.1)

## 2018-09-07 LAB — TYPE AND SCREEN
ABO/RH(D): A POS
Antibody Screen: NEGATIVE

## 2018-09-07 LAB — ABO/RH: ABO/RH(D): A POS

## 2018-09-07 LAB — PROTIME-INR
INR: 1.1 (ref 0.8–1.2)
Prothrombin Time: 13.9 seconds (ref 11.4–15.2)

## 2018-09-07 LAB — SURGICAL PCR SCREEN
MRSA, PCR: POSITIVE — AB
Staphylococcus aureus: POSITIVE — AB

## 2018-09-07 LAB — APTT: aPTT: 31 seconds (ref 24–36)

## 2018-09-07 NOTE — Progress Notes (Signed)
[facility]up[facility] [address] [facility] positive MRSA and Staph. Left message on pt's voicemail informing him of results and need to pick up Rx.

## 2018-09-07 NOTE — Progress Notes (Signed)
PCP - Dr. Iona Beard [place] Cardiologist - denies  Chest x-ray - 08/21/18 (requested from Dr. Matthias Hughs office) EKG - 08/21/18 (requested from Dr. Matthias Hughs office) Stress Test - denies ECHO - denies Cardiac Cath - denies  Sleep Study - N/A CPAP -   Fasting Blood Sugar - N/A Checks Blood Sugar _____ times a day  Blood Thinner Instructions: N/A Aspirin Instructions: N/A  Anesthesia review: yes - pt has seen a doctor at [facility], but he states he's not sure why he had to see them. Pt also states he had an EKG and CXR done on 09/19/18 at Dr. Matthias Hughs office and was diagnosed with pneumonia at that time. States he took antibiotics as prescribed, but he states he never felt bad.   Patient denies shortness of breath, fever, cough and chest pain at PAT appointment   Patient verbalized understanding of instructions that were given to them at the PAT appointment. Patient was also instructed that they will need to review over the PAT instructions again at home before surgery.

## 2018-09-07 NOTE — Pre-Procedure Instructions (Signed)
Jeffrey Shannon  09/07/2018    Your procedure is scheduled on Monday, September 18, 2018 at 10:00 AM.   Report to [facility] Entrance "A" Admitting Office at 8:00 AM.   Call this number if you have problems the morning of surgery: [phone]              Questions prior to day of surgery, please call [phone] between 8 & 4 PM.   Remember:  Do not eat or drink after midnight "Sunday, 09/17/18.  Take these medicines the morning of surgery with A SIP OF WATER: Amlodipine (Norvasc)  Do not use NSAIDS (Ibuprofen, Aleve, etc), Aspirin products (Goody's, BC Powders, etc) or Herbal medications 7 days prior to surgery.    Do not wear jewelry.  Do not wear lotions, powders, cologne or deodorant.  Men may shave face and neck.  Do not bring valuables to the hospital.  [facility] is not responsible for any belongings or valuables.  Contacts, dentures or bridgework may not be worn into surgery.  Leave your suitcase in the car.  After surgery it may be brought to your room.  For patients admitted to the hospital, discharge time will be determined by your treatment team.  [facility] - Preparing for Surgery  Before surgery, you can play an important role.  Because skin is not sterile, your skin needs to be as free of germs as possible.  You can reduce the number of germs on you skin by washing with CHG (chlorahexidine gluconate) soap before surgery.  CHG is an antiseptic cleaner which kills germs and bonds with the skin to continue killing germs even after washing.  Oral Hygiene is also important in reducing the risk of infection.  Remember to brush your teeth with your regular toothpaste the morning of surgery.  Please DO NOT use if you have an allergy to CHG or antibacterial soaps.  If your skin becomes reddened/irritated stop using the CHG and inform your nurse when you arrive at Short Stay.  Do not shave (including legs and underarms) for at least 48 hours prior to the first CHG shower.   You may shave your face.  Please follow these instructions carefully:   1.  Shower with CHG Soap the night before surgery and the morning of Surgery.  2.  If you choose to wash your hair, wash your hair first as usual with your normal shampoo.  3.  After you shampoo, rinse your hair and body thoroughly to remove the shampoo. 4.  Use CHG as you would any other liquid soap.  You can apply chg directly to the skin and wash gently with a      scrungie or washcloth.           5.  Apply the CHG Soap to your body ONLY FROM THE NECK DOWN.   Do not use on open wounds or open sores. Avoid contact with your eyes, ears, mouth and genitals (private parts).  Wash genitals (private parts) with your normal soap.  6.  Wash thoroughly, paying special attention to the area where your surgery will be performed.  7.  Thoroughly rinse your body with warm water from the neck down.  8.  DO NOT shower/wash with your normal soap after using and rinsing off the CHG Soap.  9.  Pat yourself dry with a clean towel.            10" .  Wear clean pajamas.  11.  Place clean sheets on your bed the night of your first shower and do not sleep with pets.  Day of Surgery  Shower as above. Do not apply any lotions/deodorants the morning of surgery.   Please wear clean clothes to the hospital. Remember to brush your teeth with toothpaste.   Please read over the fact sheets that you were given.

## 2018-09-08 NOTE — Anesthesia Preprocedure Evaluation (Addendum)
Anesthesia Evaluation  Patient identified by MRN, date of birth, ID band Patient awake    Reviewed: Allergy & Precautions, NPO status , Patient's Chart, lab work & pertinent test results  History of Anesthesia Complications Negative for: history of anesthetic complications  Airway Mallampati: I  TM Distance: >3 FB Neck ROM: Full    Dental  (+) Dental Advisory Given, Poor Dentition, Missing   Pulmonary former smoker (quit 2009),    breath sounds clear to auscultation       Cardiovascular hypertension, Pt. on medications (-) angina Rhythm:Regular Rate:Normal     Neuro/Psych negative neurological ROS     GI/Hepatic negative GI ROS, Neg liver ROS,   Endo/Other  negative endocrine ROS  Renal/GU Renal disease     Musculoskeletal  (+) Arthritis ,   Abdominal   Peds  Hematology plt 192k   Anesthesia Other Findings   Reproductive/Obstetrics                           Anesthesia Physical Anesthesia Plan  ASA: II  Anesthesia Plan: Spinal   Post-op Pain Management:    Induction: Intravenous  PONV Risk Score and Plan: 1 and Ondansetron and Treatment may vary due to age or medical condition  Airway Management Planned: Natural Airway and Simple Face Mask  Additional Equipment:   Intra-op Plan:   Post-operative Plan:   Informed Consent: I have reviewed the patients History and Physical, chart, labs and discussed the procedure including the risks, benefits and alternatives for the proposed anesthesia with the patient or authorized representative who has indicated his/her understanding and acceptance.     Dental advisory given  Plan Discussed with: CRNA and Surgeon  Anesthesia Plan Comments: (Seen by nephrology 08/21/18 for eval of CKD. Per notes, hip replacement was discussed, CKD deemed not progressive, advised recheck in 1 year.  Medical and cardiac clearance by PCP Dr. Vista Lawman on pt  chart.)     Anesthesia Quick Evaluation

## 2018-09-15 MED ORDER — VANCOMYCIN HCL IN DEXTROSE 1-5 GM/200ML-% IV SOLN
1000.0000 mg | INTRAVENOUS | Status: AC
  Administered 2018-09-18: 1000 mg via INTRAVENOUS
  Filled 2018-09-15: qty 200

## 2018-09-15 MED ORDER — CEFAZOLIN SODIUM-DEXTROSE 2-4 GM/100ML-% IV SOLN
2.0000 g | INTRAVENOUS | Status: AC
  Administered 2018-09-18: 2 g via INTRAVENOUS
  Filled 2018-09-15: qty 100

## 2018-09-15 MED ORDER — TRANEXAMIC ACID 1000 MG/10ML IV SOLN
2000.0000 mg | INTRAVENOUS | Status: AC
  Administered 2018-09-18: 2000 mg via TOPICAL
  Filled 2018-09-15: qty 20

## 2018-09-15 MED ORDER — TRANEXAMIC ACID-NACL 1000-0.7 MG/100ML-% IV SOLN
1000.0000 mg | INTRAVENOUS | Status: AC
  Administered 2018-09-18: 1000 mg via INTRAVENOUS
  Filled 2018-09-15: qty 100

## 2018-09-18 ENCOUNTER — Observation Stay (HOSPITAL_COMMUNITY): Payer: PPO

## 2018-09-18 ENCOUNTER — Encounter (HOSPITAL_COMMUNITY)
Admission: RE | Disposition: A | Discharge status: Home / Self Care | Payer: Self-pay | Source: Home / Self Care | Attending: Orthopaedic Surgery

## 2018-09-18 ENCOUNTER — Encounter (HOSPITAL_COMMUNITY): Payer: Self-pay

## 2018-09-18 ENCOUNTER — Other Ambulatory Visit: Payer: Self-pay

## 2018-09-18 ENCOUNTER — Ambulatory Visit (HOSPITAL_COMMUNITY): Payer: PPO

## 2018-09-18 ENCOUNTER — Ambulatory Visit (HOSPITAL_COMMUNITY): Payer: PPO | Admitting: Physician Assistant

## 2018-09-18 ENCOUNTER — Ambulatory Visit (HOSPITAL_COMMUNITY): Payer: PPO | Admitting: Anesthesiology

## 2018-09-18 ENCOUNTER — Observation Stay (HOSPITAL_COMMUNITY)
Admission: RE | Admit: 2018-09-18 | Discharge: 2018-09-19 | Disposition: A | Discharge status: Home / Self Care | Payer: PPO | Attending: Orthopaedic Surgery | Admitting: Orthopaedic Surgery

## 2018-09-18 DIAGNOSIS — Z96641 Presence of right artificial hip joint: Secondary | ICD-10-CM | POA: Diagnosis not present

## 2018-09-18 DIAGNOSIS — R2689 Other abnormalities of gait and mobility: Secondary | ICD-10-CM | POA: Insufficient documentation

## 2018-09-18 DIAGNOSIS — Z419 Encounter for procedure for purposes other than remedying health state, unspecified: Secondary | ICD-10-CM

## 2018-09-18 DIAGNOSIS — Z87891 Personal history of nicotine dependence: Secondary | ICD-10-CM | POA: Diagnosis not present

## 2018-09-18 DIAGNOSIS — Z79899 Other long term (current) drug therapy: Secondary | ICD-10-CM | POA: Insufficient documentation

## 2018-09-18 DIAGNOSIS — M6281 Muscle weakness (generalized): Secondary | ICD-10-CM | POA: Insufficient documentation

## 2018-09-18 DIAGNOSIS — I129 Hypertensive chronic kidney disease with stage 1 through stage 4 chronic kidney disease, or unspecified chronic kidney disease: Secondary | ICD-10-CM | POA: Diagnosis not present

## 2018-09-18 DIAGNOSIS — I10 Essential (primary) hypertension: Secondary | ICD-10-CM | POA: Insufficient documentation

## 2018-09-18 DIAGNOSIS — N189 Chronic kidney disease, unspecified: Secondary | ICD-10-CM | POA: Diagnosis not present

## 2018-09-18 DIAGNOSIS — Z471 Aftercare following joint replacement surgery: Secondary | ICD-10-CM | POA: Diagnosis not present

## 2018-09-18 DIAGNOSIS — Z96649 Presence of unspecified artificial hip joint: Secondary | ICD-10-CM

## 2018-09-18 DIAGNOSIS — M1611 Unilateral primary osteoarthritis, right hip: Principal | ICD-10-CM

## 2018-09-18 HISTORY — PX: TOTAL HIP ARTHROPLASTY: SHX124

## 2018-09-18 SURGERY — ARTHROPLASTY, HIP, TOTAL, ANTERIOR APPROACH
Anesthesia: Spinal | Site: Hip | Laterality: Right

## 2018-09-18 MED ORDER — SODIUM CHLORIDE 0.9 % IV SOLN
INTRAVENOUS | Status: DC | PRN
  Administered 2018-09-18: 25 ug/min via INTRAVENOUS

## 2018-09-18 MED ORDER — BUPIVACAINE IN DEXTROSE 0.75-8.25 % IT SOLN
INTRATHECAL | Status: DC | PRN
  Administered 2018-09-18: 15 mg via INTRATHECAL

## 2018-09-18 MED ORDER — GABAPENTIN 300 MG PO CAPS
300.0000 mg | ORAL_CAPSULE | Freq: Three times a day (TID) | ORAL | Status: DC
  Administered 2018-09-18 – 2018-09-19 (×3): 300 mg via ORAL
  Filled 2018-09-18 (×3): qty 1

## 2018-09-18 MED ORDER — METHOCARBAMOL 500 MG PO TABS: 500.0000 mg | ORAL_TABLET | Freq: Four times a day (QID) | ORAL | Status: DC | PRN

## 2018-09-18 MED ORDER — DEXAMETHASONE SODIUM PHOSPHATE 10 MG/ML IJ SOLN
10.0000 mg | Freq: Once | INTRAMUSCULAR | Status: AC
  Administered 2018-09-19: 10 mg via INTRAVENOUS
  Filled 2018-09-18: qty 1

## 2018-09-18 MED ORDER — METHOCARBAMOL 1000 MG/10ML IJ SOLN
500.0000 mg | Freq: Four times a day (QID) | INTRAVENOUS | Status: DC | PRN
  Filled 2018-09-18: qty 5

## 2018-09-18 MED ORDER — AMLODIPINE BESYLATE 10 MG PO TABS
10.0000 mg | ORAL_TABLET | Freq: Every day | ORAL | Status: DC
  Administered 2018-09-18 – 2018-09-19 (×2): 10 mg via ORAL
  Filled 2018-09-18 (×2): qty 1

## 2018-09-18 MED ORDER — CHLORHEXIDINE GLUCONATE 4 % EX LIQD: 60.0000 mL | Freq: Once | CUTANEOUS | Status: DC

## 2018-09-18 MED ORDER — OXYCODONE HCL ER 10 MG PO T12A
10.0000 mg | EXTENDED_RELEASE_TABLET | Freq: Two times a day (BID) | ORAL | Status: DC
  Administered 2018-09-18 – 2018-09-19 (×2): 10 mg via ORAL
  Filled 2018-09-18 (×2): qty 1

## 2018-09-18 MED ORDER — LACTATED RINGERS IV SOLN
INTRAVENOUS | Status: DC
  Administered 2018-09-18 (×2): via INTRAVENOUS

## 2018-09-18 MED ORDER — CEFAZOLIN SODIUM-DEXTROSE 2-4 GM/100ML-% IV SOLN
2.0000 g | Freq: Four times a day (QID) | INTRAVENOUS | Status: AC
  Administered 2018-09-18 – 2018-09-19 (×3): 2 g via INTRAVENOUS
  Filled 2018-09-18 (×3): qty 100

## 2018-09-18 MED ORDER — METOCLOPRAMIDE HCL 5 MG/ML IJ SOLN: 5.0000 mg | Freq: Three times a day (TID) | INTRAMUSCULAR | Status: DC | PRN

## 2018-09-18 MED ORDER — PROPOFOL 1000 MG/100ML IV EMUL
INTRAVENOUS | Status: AC
  Filled 2018-09-18: qty 100

## 2018-09-18 MED ORDER — MAGNESIUM CITRATE PO SOLN: 1.0000 | Freq: Once | ORAL | Status: DC | PRN

## 2018-09-18 MED ORDER — OXYCODONE HCL 5 MG PO TABS: 5.0000 mg | ORAL_TABLET | ORAL | Status: DC | PRN

## 2018-09-18 MED ORDER — PHENYLEPHRINE HCL 10 MG/ML IJ SOLN
INTRAMUSCULAR | Status: DC | PRN
  Administered 2018-09-18 (×4): 60 ug via INTRAVENOUS

## 2018-09-18 MED ORDER — GABAPENTIN 600 MG PO TABS
1200.0000 mg | ORAL_TABLET | Freq: Every day | ORAL | Status: DC
  Administered 2018-09-18: 1200 mg via ORAL
  Filled 2018-09-18: qty 2

## 2018-09-18 MED ORDER — PROMETHAZINE HCL 25 MG/ML IJ SOLN: 6.2500 mg | INTRAMUSCULAR | Status: DC | PRN

## 2018-09-18 MED ORDER — ACETAMINOPHEN 325 MG PO TABS: 325.0000 mg | ORAL_TABLET | Freq: Four times a day (QID) | ORAL | Status: DC | PRN

## 2018-09-18 MED ORDER — PROPOFOL 500 MG/50ML IV EMUL
INTRAVENOUS | Status: DC | PRN
  Administered 2018-09-18: 20 ug/kg/min via INTRAVENOUS

## 2018-09-18 MED ORDER — ASPIRIN EC 81 MG PO TBEC: 81.0000 mg | DELAYED_RELEASE_TABLET | Freq: Two times a day (BID) | ORAL | 0 refills | Status: AC

## 2018-09-18 MED ORDER — DIPHENHYDRAMINE HCL 50 MG/ML IJ SOLN
INTRAMUSCULAR | Status: DC | PRN
  Administered 2018-09-18: 12.5 mg via INTRAVENOUS

## 2018-09-18 MED ORDER — OXYCODONE HCL ER 10 MG PO T12A: 10.0000 mg | EXTENDED_RELEASE_TABLET | Freq: Two times a day (BID) | ORAL | 0 refills | Status: AC

## 2018-09-18 MED ORDER — MENTHOL 3 MG MT LOZG: 1.0000 | LOZENGE | OROMUCOSAL | Status: DC | PRN

## 2018-09-18 MED ORDER — ALUM & MAG HYDROXIDE-SIMETH 200-200-20 MG/5ML PO SUSP: 30.0000 mL | ORAL | Status: DC | PRN

## 2018-09-18 MED ORDER — KETOROLAC TROMETHAMINE 15 MG/ML IJ SOLN
30.0000 mg | Freq: Four times a day (QID) | INTRAMUSCULAR | Status: AC
  Administered 2018-09-18 – 2018-09-19 (×3): 30 mg via INTRAVENOUS
  Filled 2018-09-18 (×3): qty 2

## 2018-09-18 MED ORDER — SENNOSIDES-DOCUSATE SODIUM 8.6-50 MG PO TABS: 1.0000 | ORAL_TABLET | Freq: Every evening | ORAL | 1 refills | Status: AC | PRN

## 2018-09-18 MED ORDER — MIDAZOLAM HCL 2 MG/2ML IJ SOLN: 0.5000 mg | Freq: Once | INTRAMUSCULAR | Status: DC | PRN

## 2018-09-18 MED ORDER — ACETAMINOPHEN 500 MG PO TABS
1000.0000 mg | ORAL_TABLET | Freq: Four times a day (QID) | ORAL | Status: AC
  Administered 2018-09-18 – 2018-09-19 (×3): 1000 mg via ORAL
  Filled 2018-09-18 (×3): qty 2

## 2018-09-18 MED ORDER — METOCLOPRAMIDE HCL 5 MG PO TABS: 5.0000 mg | ORAL_TABLET | Freq: Three times a day (TID) | ORAL | Status: DC | PRN

## 2018-09-18 MED ORDER — 0.9 % SODIUM CHLORIDE (POUR BTL) OPTIME
TOPICAL | Status: DC | PRN
  Administered 2018-09-18: 1000 mL

## 2018-09-18 MED ORDER — LACTATED RINGERS IV SOLN: INTRAVENOUS | Status: DC

## 2018-09-18 MED ORDER — HYDRALAZINE HCL 20 MG/ML IJ SOLN
20.0000 mg | Freq: Once | INTRAMUSCULAR | Status: AC
  Administered 2018-09-18: 20 mg via INTRAVENOUS
  Filled 2018-09-18: qty 1

## 2018-09-18 MED ORDER — DIPHENHYDRAMINE HCL 12.5 MG/5ML PO ELIX: 25.0000 mg | ORAL_SOLUTION | ORAL | Status: DC | PRN

## 2018-09-18 MED ORDER — VANCOMYCIN HCL 1 G IV SOLR
INTRAVENOUS | Status: DC | PRN
  Administered 2018-09-18: 1000 mg via TOPICAL

## 2018-09-18 MED ORDER — LIDOCAINE 2% (20 MG/ML) 5 ML SYRINGE
INTRAMUSCULAR | Status: DC | PRN
  Administered 2018-09-18: 50 mg via INTRAVENOUS

## 2018-09-18 MED ORDER — MIDAZOLAM HCL 5 MG/5ML IJ SOLN
INTRAMUSCULAR | Status: DC | PRN
  Administered 2018-09-18: 1 mg via INTRAVENOUS

## 2018-09-18 MED ORDER — SORBITOL 70 % SOLN: 30.0000 mL | Freq: Every day | Status: DC | PRN

## 2018-09-18 MED ORDER — MIDAZOLAM HCL 2 MG/2ML IJ SOLN
INTRAMUSCULAR | Status: AC
  Filled 2018-09-18: qty 2

## 2018-09-18 MED ORDER — ONDANSETRON HCL 4 MG/2ML IJ SOLN: 4.0000 mg | Freq: Four times a day (QID) | INTRAMUSCULAR | Status: DC | PRN

## 2018-09-18 MED ORDER — DEXAMETHASONE SODIUM PHOSPHATE 10 MG/ML IJ SOLN
INTRAMUSCULAR | Status: DC | PRN
  Administered 2018-09-18: 4 mg via INTRAVENOUS

## 2018-09-18 MED ORDER — TRANEXAMIC ACID-NACL 1000-0.7 MG/100ML-% IV SOLN
1000.0000 mg | Freq: Once | INTRAVENOUS | Status: AC
  Administered 2018-09-18: 1000 mg via INTRAVENOUS
  Filled 2018-09-18: qty 100

## 2018-09-18 MED ORDER — FENTANYL CITRATE (PF) 250 MCG/5ML IJ SOLN
INTRAMUSCULAR | Status: AC
  Filled 2018-09-18: qty 5

## 2018-09-18 MED ORDER — HYDROMORPHONE HCL 1 MG/ML IJ SOLN: 0.5000 mg | INTRAMUSCULAR | Status: DC | PRN

## 2018-09-18 MED ORDER — FENTANYL CITRATE (PF) 250 MCG/5ML IJ SOLN
INTRAMUSCULAR | Status: DC | PRN
  Administered 2018-09-18 (×2): 50 ug via INTRAVENOUS

## 2018-09-18 MED ORDER — HYDROMORPHONE HCL 1 MG/ML IJ SOLN: 0.2500 mg | INTRAMUSCULAR | Status: DC | PRN

## 2018-09-18 MED ORDER — PROMETHAZINE HCL 25 MG PO TABS: 25.0000 mg | ORAL_TABLET | Freq: Four times a day (QID) | ORAL | 1 refills | Status: AC | PRN

## 2018-09-18 MED ORDER — PHENOL 1.4 % MT LIQD: 1.0000 | OROMUCOSAL | Status: DC | PRN

## 2018-09-18 MED ORDER — METHOCARBAMOL 750 MG PO TABS: 750.0000 mg | ORAL_TABLET | Freq: Two times a day (BID) | ORAL | 0 refills | Status: AC | PRN

## 2018-09-18 MED ORDER — SODIUM CHLORIDE 0.9 % IR SOLN
Status: DC | PRN
  Administered 2018-09-18: 3000 mL

## 2018-09-18 MED ORDER — VANCOMYCIN HCL 1000 MG IV SOLR
INTRAVENOUS | Status: AC
  Filled 2018-09-18: qty 1000

## 2018-09-18 MED ORDER — ONDANSETRON HCL 4 MG PO TABS: 4.0000 mg | ORAL_TABLET | Freq: Four times a day (QID) | ORAL | Status: DC | PRN

## 2018-09-18 MED ORDER — OXYCODONE HCL 5 MG PO TABS: 5.0000 mg | ORAL_TABLET | Freq: Three times a day (TID) | ORAL | 0 refills | Status: AC | PRN

## 2018-09-18 MED ORDER — PROPOFOL 10 MG/ML IV BOLUS
INTRAVENOUS | Status: DC | PRN
  Administered 2018-09-18 (×3): 11 mg via INTRAVENOUS

## 2018-09-18 MED ORDER — OXYCODONE HCL 5 MG PO TABS: 10.0000 mg | ORAL_TABLET | ORAL | Status: DC | PRN

## 2018-09-18 MED ORDER — SODIUM CHLORIDE 0.9 % IV SOLN
INTRAVENOUS | Status: DC
  Administered 2018-09-18: 16:00:00 via INTRAVENOUS

## 2018-09-18 MED ORDER — MEPERIDINE HCL 50 MG/ML IJ SOLN: 6.2500 mg | INTRAMUSCULAR | Status: DC | PRN

## 2018-09-18 MED ORDER — ASPIRIN 81 MG PO CHEW
81.0000 mg | CHEWABLE_TABLET | Freq: Two times a day (BID) | ORAL | Status: DC
  Administered 2018-09-18 – 2018-09-19 (×2): 81 mg via ORAL
  Filled 2018-09-18 (×2): qty 1

## 2018-09-18 MED ORDER — GLYCOPYRROLATE 0.2 MG/ML IJ SOLN
INTRAMUSCULAR | Status: DC | PRN
  Administered 2018-09-18: 0.2 mg via INTRAVENOUS

## 2018-09-18 MED ORDER — POLYETHYLENE GLYCOL 3350 17 G PO PACK: 17.0000 g | PACK | Freq: Every day | ORAL | Status: DC | PRN

## 2018-09-18 MED ORDER — DOCUSATE SODIUM 100 MG PO CAPS
100.0000 mg | ORAL_CAPSULE | Freq: Two times a day (BID) | ORAL | Status: DC
  Administered 2018-09-18 – 2018-09-19 (×3): 100 mg via ORAL
  Filled 2018-09-18 (×3): qty 1

## 2018-09-18 MED ORDER — ONDANSETRON HCL 4 MG PO TABS: 4.0000 mg | ORAL_TABLET | Freq: Three times a day (TID) | ORAL | 0 refills | Status: AC | PRN

## 2018-09-18 SURGICAL SUPPLY — 58 items
BAG DECANTER FOR FLEXI CONT (MISCELLANEOUS) ×3 IMPLANT
BALL HIP ARTICU EZE 36 8.5 (Hips) ×1 IMPLANT
CELLS DAT CNTRL 66122 CELL SVR (MISCELLANEOUS) IMPLANT
CLOSURE STERI-STRIP 1/2X4 (GAUZE/BANDAGES/DRESSINGS) ×1
CLSR STERI-STRIP ANTIMIC 1/2X4 (GAUZE/BANDAGES/DRESSINGS) ×2 IMPLANT
COVER PERINEAL POST (MISCELLANEOUS) ×3 IMPLANT
COVER SURGICAL LIGHT HANDLE (MISCELLANEOUS) ×3 IMPLANT
COVER WAND RF STERILE (DRAPES) ×3 IMPLANT
CUP ACET PNNCL SECTR W/GRIP 56 (Hips) ×1 IMPLANT
DRAPE C-ARM 42X72 X-RAY (DRAPES) ×3 IMPLANT
DRAPE POUCH INSTRU U-SHP 10X18 (DRAPES) ×6 IMPLANT
DRAPE STERI IOBAN 125X83 (DRAPES) ×3 IMPLANT
DRAPE U-SHAPE 47X51 STRL (DRAPES) ×6 IMPLANT
DRSG AQUACEL AG ADV 3.5X10 (GAUZE/BANDAGES/DRESSINGS) ×3 IMPLANT
DURAPREP 26ML APPLICATOR (WOUND CARE) ×6 IMPLANT
ELECT BLADE 4.0 EZ CLEAN MEGAD (MISCELLANEOUS) ×3
ELECT REM PT RETURN 9FT ADLT (ELECTROSURGICAL) ×3
ELECTRODE BLDE 4.0 EZ CLN MEGD (MISCELLANEOUS) ×1 IMPLANT
ELECTRODE REM PT RTRN 9FT ADLT (ELECTROSURGICAL) ×1 IMPLANT
GLOVE BIOGEL PI IND STRL 7.0 (GLOVE) ×1 IMPLANT
GLOVE BIOGEL PI INDICATOR 7.0 (GLOVE) ×2
GLOVE ECLIPSE 7.0 STRL STRAW (GLOVE) ×6 IMPLANT
GLOVE SKINSENSE NS SZ7.5 (GLOVE) ×2
GLOVE SKINSENSE STRL SZ7.5 (GLOVE) ×1 IMPLANT
GLOVE SURG SYN 7.5  E (GLOVE) ×8
GLOVE SURG SYN 7.5 E (GLOVE) ×4 IMPLANT
GOWN SRG XL XLNG 56XLVL 4 (GOWN DISPOSABLE) ×1 IMPLANT
GOWN STRL NON-REIN XL XLG LVL4 (GOWN DISPOSABLE) ×2
GOWN STRL REUS W/ TWL LRG LVL3 (GOWN DISPOSABLE) IMPLANT
GOWN STRL REUS W/ TWL XL LVL3 (GOWN DISPOSABLE) ×1 IMPLANT
GOWN STRL REUS W/TWL LRG LVL3 (GOWN DISPOSABLE)
GOWN STRL REUS W/TWL XL LVL3 (GOWN DISPOSABLE) ×2
HANDPIECE INTERPULSE COAX TIP (DISPOSABLE) ×2
HIP BALL ARTICU EZE 36 8.5 (Hips) ×3 IMPLANT
HOOD PEEL AWAY FLYTE STAYCOOL (MISCELLANEOUS) ×6 IMPLANT
IV NS IRRIG 3000ML ARTHROMATIC (IV SOLUTION) ×3 IMPLANT
KIT BASIN OR (CUSTOM PROCEDURE TRAY) ×3 IMPLANT
LINER NEUTRAL 52MMX36MMX56N (Liner) ×3 IMPLANT
MARKER SKIN DUAL TIP RULER LAB (MISCELLANEOUS) ×3 IMPLANT
NEEDLE SPNL 18GX3.5 QUINCKE PK (NEEDLE) ×3 IMPLANT
PACK TOTAL JOINT (CUSTOM PROCEDURE TRAY) ×3 IMPLANT
PACK UNIVERSAL I (CUSTOM PROCEDURE TRAY) ×3 IMPLANT
PINN SECTOR W/GRIP ACE CUP 56 (Hips) ×3 IMPLANT
RTRCTR WOUND ALEXIS 18CM MED (MISCELLANEOUS)
SAW OSC TIP CART 19.5X105X1.3 (SAW) ×3 IMPLANT
SET HNDPC FAN SPRY TIP SCT (DISPOSABLE) ×1 IMPLANT
STAPLER VISISTAT 35W (STAPLE) IMPLANT
STEM FEMORAL SZ 6MM STD ACTIS (Stem) ×3 IMPLANT
SUT ETHIBOND 2 V 37 (SUTURE) ×3 IMPLANT
SUT VIC AB 1 CT1 27 (SUTURE) ×2
SUT VIC AB 1 CT1 27XBRD ANBCTR (SUTURE) ×1 IMPLANT
SUT VIC AB 2-0 CT1 27 (SUTURE) ×4
SUT VIC AB 2-0 CT1 TAPERPNT 27 (SUTURE) ×2 IMPLANT
SYRINGE 60CC LL (MISCELLANEOUS) ×3 IMPLANT
TOWEL OR 17X26 10 PK STRL BLUE (TOWEL DISPOSABLE) ×3 IMPLANT
TRAY CATH 16FR W/PLASTIC CATH (SET/KITS/TRAYS/PACK) IMPLANT
TRAY FOLEY CATH 16FR SILVER (SET/KITS/TRAYS/PACK) ×3 IMPLANT
YANKAUER SUCT BULB TIP NO VENT (SUCTIONS) ×3 IMPLANT

## 2018-09-18 NOTE — Anesthesia Procedure Notes (Signed)
Spinal  Patient location during procedure: OR End time: 09/18/2018 7:37 AM Staffing Anesthesiologist: Annye Asa, MD Performed: anesthesiologist  Preanesthetic Checklist Completed: patient identified, site marked, surgical consent, pre-op evaluation, timeout performed, IV checked, risks and benefits discussed and monitors and equipment checked Spinal Block Patient position: sitting Prep: site prepped and draped and DuraPrep Patient monitoring: blood pressure, continuous pulse ox, cardiac monitor and heart rate Approach: midline Location: L3-4 Injection technique: single-shot Needle Needle type: Pencan  Needle gauge: 24 G Needle length: 9 cm Additional Notes Pt identified in Operating room.  Monitors applied. Working IV access confirmed. Sterile prep, drape lumbar spine.  1% lido local L 3,4.  #24ga Pencan into clear CSF L 3,4.  15mg  0.75% Bupivacaine with dextrose injected with asp CSF beginning and end of injection.  Patient asymptomatic, VSS, no heme aspirated, tolerated well.  Jenita Seashore, MD

## 2018-09-18 NOTE — Discharge Instructions (Signed)

## 2018-09-18 NOTE — Anesthesia Postprocedure Evaluation (Signed)
Anesthesia Post Note  Patient: Jeffrey Shannon  Procedure(s) Performed: RIGHT TOTAL HIP ARTHROPLASTY ANTERIOR APPROACH (Right Hip)     Patient location during evaluation: PACU Anesthesia Type: Spinal Level of consciousness: awake and alert, patient cooperative and oriented Pain management: pain level controlled Vital Signs Assessment: post-procedure vital signs reviewed and stable Respiratory status: spontaneous breathing, nonlabored ventilation and respiratory function stable Cardiovascular status: blood pressure returned to baseline and stable Postop Assessment: spinal receding Anesthetic complications: no    Last Vitals:  Vitals:   09/18/18 1148 09/18/18 1203  BP: (!) 144/95 (!) 144/91  Pulse: (!) 59 66  Resp: 16 12  Temp:    SpO2: 95% 97%    Last Pain:  Vitals:   09/18/18 1203  PainSc: 0-No pain                 Carnella Fryman,E. Raquel Sayres

## 2018-09-18 NOTE — Op Note (Signed)
RIGHT TOTAL HIP ARTHROPLASTY ANTERIOR APPROACH  Procedure Note Jeffrey Shannon   [id number]  Pre-op Diagnosis: right hip degenerative joint disease     Post-op Diagnosis: same   Operative Procedures  1. Total hip replacement; Right hip; uncemented [id number]   Personnel  Surgeon(s): Leandrew Koyanagi, MD  Assist: Madalyn Rob, PA-C; necessary for the timely completion of procedure and due to complexity of procedure.   Anesthesia: spinal  Prosthesis: Depuy Acetabulum: Pinnacle 56 mm Femur: Actis 7 STD Head: 36 mm size: +8.5 Liner: +0 neutral Bearing Type: metal on poly  Total Hip Arthroplasty (Anterior Approach) Op Note:  After informed consent was obtained and the operative extremity marked in the holding area, the patient was brought back to the operating room and placed supine on the HANA table. Next, the operative extremity was prepped and draped in normal sterile fashion. Surgical timeout occurred verifying patient identification, surgical site, surgical procedure and administration of antibiotics.  A modified anterior Smith-Peterson approach to the hip was performed, using the interval between tensor fascia lata and sartorius.  Dissection was carried bluntly down onto the anterior hip capsule. The lateral femoral circumflex vessels were identified and coagulated. A capsulotomy was performed and the capsular flaps tagged for later repair.  Fluoroscopy was utilized to prepare for the femoral neck cut. The neck osteotomy was performed. The femoral head was removed, the acetabular rim was cleared of soft tissue and attention was turned to reaming the acetabulum.  Sequential reaming was performed under fluoroscopic guidance. We reamed to a size 55 mm, and then impacted the acetabular shell. The liner was then placed after irrigation and attention turned to the femur.  After placing the femoral hook, the leg was taken to externally rotated, extended and adducted position taking care  to perform soft tissue releases to allow for adequate mobilization of the femur. Soft tissue was cleared from the shoulder of the greater trochanter and the hook elevator used to improve exposure of the proximal femur. Sequential broaching performed up to a size 7. Trial neck and head were placed. The leg was brought back up to neutral and the construct reduced. The position and sizing of components, offset and leg lengths were checked using fluoroscopy. Stability of the  construct was checked in extension and external rotation without any subluxation or impingement of prosthesis. We dislocated the prosthesis, dropped the leg back into position, removed trial components, and irrigated copiously. The final stem and head was then placed, the leg brought back up, the system reduced and fluoroscopy used to verify positioning.  We irrigated, obtained hemostasis and closed the capsule using #2 ethibond suture.  One gram of vancomycin powder was placed in the surgical bed. The fascia was closed with #1 vicryl plus, the deep fat layer was closed with 0 vicryl, the subcutaneous layers closed with 2.0 Vicryl Plus and the skin closed with 3.0 monocryl and steri strips. A sterile dressing was applied. The patient was awakened in the operating room and taken to recovery in stable condition.  All sponge, needle, and instrument counts were correct at the end of the case.   Position: supine  Complications: none.  Time Out: performed   Drains/Packing: none  Estimated blood loss: see anesthesia record  Returned to Recovery Room: in good condition.   Antibiotics: yes   Mechanical VTE (DVT) Prophylaxis: sequential compression devices, TED thigh-high  Chemical VTE (DVT) Prophylaxis: aspirin   Fluid Replacement: see anesthesia record  Specimens Removed: 1 to pathology  Sponge and Instrument Count Correct? yes   PACU: portable radiograph - low AP   Plan/RTC: Return in 2 weeks for staple removal. Weight  Bearing/Load Lower Extremity: full  Hip precautions: none Suture Removal: 2 weeks   N. Eduard Roux, MD [place] [phone] 9:03 AM     Implant Name Type Inv. Item Serial No. Manufacturer Lot No. LRB No. Used  PINN SECTOR W/GRIP ACE CUP 56 - [id number] Hips PINN SECTOR W/GRIP ACE CUP 56  DEPUY SYNTHES [id number] Right 1  LINER NEUTRAL [id number] - [id number] Liner LINER NEUTRAL [id number]  DEPUY SYNTHES [id number] Right 1

## 2018-09-18 NOTE — Progress Notes (Signed)
Physical Therapy Evaluation Patient Details Name: Jeffrey Shannon MRN: [id number] DOB: 06-17-1941 Today's Date: 09/18/2018   History of Present Illness  Patient is 78 y/o male s/p R THA. PMH includes HTN and arthritis.   Clinical Impression  Patient admitted to hospital secondary to problems above and with deficits below. RN cleared patient for mobility as physician cleared patient despite high BP. Patient required min guard to stand and ambulate short distance to chair with use of RW. Used clinical judgement and limited mobility secondary to high BP. Patient asymptomatic throughout session and BP improved upon sitting. Educated and reviewed supine HEP with patient and family. Patient will continue to benefit from acute physical therapy to maximize independence and safety with functional mobility.     Follow Up Recommendations Follow surgeon's recommendation for DC plan and follow-up therapies    Equipment Recommendations  Rolling walker with 5" wheels    Recommendations for Other Services       Precautions / Restrictions Precautions Precautions: Fall Restrictions Weight Bearing Restrictions: Yes RLE Weight Bearing: Weight bearing as tolerated      Mobility  Bed Mobility Overal bed mobility: Needs Assistance Bed Mobility: Supine to Sit     Supine to sit: Supervision     General bed mobility comments: Patient required supervision for bed mobility for safety. BP supine 169/126, BP seated 148/98. Patient denies dizziness.  Transfers Overall transfer level: Needs assistance Equipment used: Rolling walker (2 wheeled) Transfers: Sit to/from Stand Sit to Stand: Min guard         General transfer comment: Patient required min guard to stand with use of RW for safety. Verbal cues for hand placement prior to standing when using RW.   Ambulation/Gait Ambulation/Gait assistance: Min guard Gait Distance (Feet): 2 Feet Assistive device: Rolling walker (2 wheeled) Gait  Pattern/deviations: Step-to pattern;Decreased step length - left;Decreased stance time - right;Decreased stride length;Decreased weight shift to right;Antalgic Gait velocity: decreased Gait velocity interpretation: <1.8 ft/sec, indicate of risk for recurrent falls General Gait Details: Patient ambulated short distance to chair with min guard and use of RW for safety. Verbal cues for sequencing with RW. BP after mobility 169/105. Functional mobility limited secondary to high blood pressure.   Stairs            Wheelchair Mobility    Modified Rankin (Stroke Patients Only)       Balance Overall balance assessment: Needs assistance Sitting-balance support: No upper extremity supported;Feet supported Sitting balance-Leahy Scale: Good     Standing balance support: Bilateral upper extremity supported Standing balance-Leahy Scale: Poor Standing balance comment: reliant on BUE support and use of RW to maintain standing balance                             Pertinent Vitals/Pain Pain Assessment: 0-10 Pain Score: 6  Pain Location: R hip Pain Descriptors / Indicators: Aching Pain Intervention(s): Limited activity within patient's tolerance;Monitored during session;Ice applied    Home Living Family/patient expects to be discharged to:: Private residence Living Arrangements: Spouse/significant other Available Help at Discharge: Family;Available 24 hours/day Type of Home: House Home Access: Stairs to enter Entrance Stairs-Rails: Left Entrance Stairs-Number of Steps: 6 Home Layout: One level Home Equipment: Bedside commode      Prior Function Level of Independence: Independent               Hand Dominance        Extremity/Trunk Assessment   Upper Extremity  Assessment Upper Extremity Assessment: Overall WFL for tasks assessed    Lower Extremity Assessment Lower Extremity Assessment: RLE deficits/detail RLE Deficits / Details: RLE deficits consistent with  post op pain and weakness    Cervical / Trunk Assessment Cervical / Trunk Assessment: Normal  Communication   Communication: No difficulties  Cognition Arousal/Alertness: Awake/alert Behavior During Therapy: WFL for tasks assessed/performed Overall Cognitive Status: Within Functional Limits for tasks assessed                                        General Comments General comments (skin integrity, edema, etc.): Patient family in room during session    Exercises Total Joint Exercises Ankle Circles/Pumps: AROM;Both;20 reps;Supine Quad Sets: AROM;Right;10 reps;Supine Heel Slides: AROM;Right;10 reps;Supine   Assessment/Plan    PT Assessment Patient needs continued PT services  PT Problem List Decreased strength;Decreased range of motion;Decreased activity tolerance;Decreased balance;Decreased mobility;Decreased knowledge of use of DME;Decreased knowledge of precautions;Pain       PT Treatment Interventions DME instruction;Gait training;Stair training;Therapeutic activities;Functional mobility training;Therapeutic exercise;Balance training;Patient/family education    PT Goals (Current goals can be found in the Care Plan section)  Acute Rehab PT Goals Patient Stated Goal: go home PT Goal Formulation: With patient Time For Goal Achievement: 10/02/18 Potential to Achieve Goals: Good    Frequency 7X/week   Barriers to discharge        Co-evaluation               AM-PAC PT "6 Clicks" Mobility  Outcome Measure Help needed turning from your back to your side while in a flat bed without using bedrails?: None Help needed moving from lying on your back to sitting on the side of a flat bed without using bedrails?: A Little Help needed moving to and from a bed to a chair (including a wheelchair)?: A Little Help needed standing up from a chair using your arms (e.g., wheelchair or bedside chair)?: A Little Help needed to walk in hospital room?: A Little Help  needed climbing 3-5 steps with a railing? : A Lot 6 Click Score: 18    End of Session Equipment Utilized During Treatment: Gait belt Activity Tolerance: Treatment limited secondary to medical complications (Comment)(high BP) Patient left: in chair;with call bell/phone within reach;with family/visitor present Nurse Communication: Mobility status;Other (comment)(pt's BP) PT Visit Diagnosis: Other abnormalities of gait and mobility (R26.89);Muscle weakness (generalized) (M62.81);Difficulty in walking, not elsewhere classified (R26.2)    Time: 0383-3383 PT Time Calculation (min) (ACUTE ONLY): 31 min   Charges:   PT Evaluation $PT Eval Low Complexity: 1 Low PT Treatments $Therapeutic Activity: 8-22 mins       Erick Blinks, SPT  Erick Blinks 09/18/2018, 6:30 PM

## 2018-09-18 NOTE — H&P (Signed)
PREOPERATIVE H&P  Chief Complaint: right hip degenerative joint disease  HPI: Jeffrey Shannon is a 78 y.o. male who presents for surgical treatment of right hip degenerative joint disease.  He denies any changes in medical history.  Past Medical History:  Diagnosis Date  . Arthritis   . Hypertension   . Pneumonia    January 2020   Past Surgical History:  Procedure Laterality Date  . COLONOSCOPY     Social History   Socioeconomic History  . Marital status: Single    Spouse name: Not on file  . Number of children: Not on file  . Years of education: Not on file  . Highest education level: Not on file  Occupational History  . Not on file  Social Needs  . Financial resource strain: Not on file  . Food insecurity:    Worry: Not on file    Inability: Not on file  . Transportation needs:    Medical: Not on file    Non-medical: Not on file  Tobacco Use  . Smoking status: Former Smoker    Last attempt to quit: 09/08/2007    Years since quitting: 11.0  . Smokeless tobacco: Never Used  Substance and Sexual Activity  . Alcohol use: Yes    Comment: occasionally  . Drug use: Never  . Sexual activity: Not on file  Lifestyle  . Physical activity:    Days per week: Not on file    Minutes per session: Not on file  . Stress: Not on file  Relationships  . Social connections:    Talks on phone: Not on file    Gets together: Not on file    Attends religious service: Not on file    Active member of club or organization: Not on file    Attends meetings of clubs or organizations: Not on file    Relationship status: Not on file  Other Topics Concern  . Not on file  Social History Narrative  . Not on file   History reviewed. No pertinent family history. No Known Allergies Prior to Admission medications   Medication Sig Start Date End Date Taking? Authorizing Provider  amLODipine (NORVASC) 10 MG tablet Take 10 mg by mouth daily. 05/16/18  Yes [provider]    gabapentin (NEURONTIN) 600 MG tablet Take 1,200 mg by mouth at bedtime.  12/01/17  Yes [provider]  Menthol, Topical Analgesic, (BENGAY EX) Apply 1 application topically daily as needed (pain).   Yes [provider]     Positive ROS: All other systems have been reviewed and were otherwise negative with the exception of those mentioned in the HPI and as above.  Physical Exam: General: Alert, no acute distress Cardiovascular: No pedal edema Respiratory: No cyanosis, no use of accessory musculature GI: abdomen soft Skin: No lesions in the area of chief complaint Neurologic: Sensation intact distally Psychiatric: Patient is competent for consent with normal mood and affect Lymphatic: no lymphedema  MUSCULOSKELETAL: exam stable  Assessment: right hip degenerative joint disease  Plan: Plan for Procedure(s): RIGHT TOTAL HIP ARTHROPLASTY ANTERIOR APPROACH  The risks benefits and alternatives were discussed with the patient including but not limited to the risks of nonoperative treatment, versus surgical intervention including infection, bleeding, nerve injury,  blood clots, cardiopulmonary complications, morbidity, mortality, among others, and they were willing to proceed.   Preoperative templating of the joint replacement has been completed, documented, and submitted to the Operating Room personnel in order to optimize intra-operative  equipment managemen  Eduard Roux, MD   09/18/2018 6:47 AM

## 2018-09-18 NOTE — Transfer of Care (Signed)
Immediate Anesthesia Transfer of Care Note  Patient: Wlliam Grosso  Procedure(s) Performed: RIGHT TOTAL HIP ARTHROPLASTY ANTERIOR APPROACH (Right Hip)  Patient Location: PACU  Anesthesia Type:Spinal  Level of Consciousness: awake, alert  and oriented  Airway & Oxygen Therapy: Patient Spontanous Breathing, Patient connected to nasal cannula oxygen and Patient connected to face mask oxygen  Post-op Assessment: Report given to RN and Post -op Vital signs reviewed and stable  Post vital signs: Reviewed and stable  Last Vitals:  Vitals Value Taken Time  BP    Temp    Pulse    Resp 9 09/18/2018  9:36 AM  SpO2    Vitals shown include unvalidated device data.  Last Pain:  Vitals:   09/18/18 0618  PainSc: 7       Patients Stated Pain Goal: 3 (23/41/44 3601)  Complications: No apparent anesthesia complications

## 2018-09-18 NOTE — Plan of Care (Signed)
  Problem: Education: Goal: Knowledge of General Education information will improve Description: Including pain rating scale, medication(s)/side effects and non-pharmacologic comfort measures Outcome: Progressing   Problem: Pain Managment: Goal: General experience of comfort will improve Outcome: Progressing   

## 2018-09-18 NOTE — Anesthesia Procedure Notes (Signed)
Procedure Name: MAC Date/Time: 09/18/2018 7:34 AM Performed by: Mariea Clonts, CRNA Pre-anesthesia Checklist: Patient identified, Suction available, Emergency Drugs available, Patient being monitored and Timeout performed Patient Re-evaluated:Patient Re-evaluated prior to induction Oxygen Delivery Method: Simple face mask and Nasal cannula

## 2018-09-18 NOTE — Progress Notes (Signed)
Notified MD of elevated DBP. Pt up in chair, alert, verbally responsive, and asymptomatic. Given one time dose of hydralazine IV. Will f/u.

## 2018-09-19 ENCOUNTER — Encounter (HOSPITAL_COMMUNITY): Payer: Self-pay | Admitting: Orthopaedic Surgery

## 2018-09-19 DIAGNOSIS — M1611 Unilateral primary osteoarthritis, right hip: Secondary | ICD-10-CM | POA: Diagnosis not present

## 2018-09-19 LAB — BASIC METABOLIC PANEL
Anion gap: 8 (ref 5–15)
BUN: 14 mg/dL (ref 8–23)
CO2: 21 mmol/L — ABNORMAL LOW (ref 22–32)
Calcium: 8.5 mg/dL — ABNORMAL LOW (ref 8.9–10.3)
Chloride: 106 mmol/L (ref 98–111)
Creatinine, Ser: 1.43 mg/dL — ABNORMAL HIGH (ref 0.61–1.24)
GFR calc Af Amer: 55 mL/min — ABNORMAL LOW (ref >60)
GFR calc non Af Amer: 48 mL/min — ABNORMAL LOW (ref >60)
Glucose, Bld: 138 mg/dL — ABNORMAL HIGH (ref 70–99)
Potassium: 3.9 mmol/L (ref 3.5–5.1)
Sodium: 135 mmol/L (ref 135–145)

## 2018-09-19 LAB — CBC
HCT: 38.1 % — ABNORMAL LOW (ref 39.0–52.0)
Hemoglobin: 12.6 g/dL — ABNORMAL LOW (ref 13.0–17.0)
MCH: 32.1 pg (ref 26.0–34.0)
MCHC: 33.1 g/dL (ref 30.0–36.0)
MCV: 96.9 fL (ref 80.0–100.0)
Platelets: 164 10*3/uL (ref 150–400)
RBC: 3.93 MIL/uL — ABNORMAL LOW (ref 4.22–5.81)
RDW: 12 % (ref 11.5–15.5)
WBC: 6.4 10*3/uL (ref 4.0–10.5)
nRBC: 0 % (ref 0.0–0.2)

## 2018-09-19 NOTE — Care Management Note (Signed)
Case Management Note  Patient Details  Name: Jeffrey Shannon MRN: [id number] Date of Birth: 08-12-1940  Subjective/Objective:   78 yr old gentleman s/p right total Hip arthroplasty.                 Action/Plan: Case manager spoke with patient concerning discharge plan and DME. Choice for Home Health offered, referral called to [facility], [place] Liaison. Patient says he lives alone but has lot of family support.    Expected Discharge Date:  09/19/18               Expected Discharge Plan:  [place]  In-House Referral:  NA  Discharge planning Services  CM Consult  Post Acute Care Choice:  Home Health, Durable Medical Equipment Choice offered to:  Patient  DME Arranged:  Walker rolling DME Agency:  [facility]  HH Arranged:  PT [place] Agency:  [facility]  Status of Service:  Completed, signed off  If discussed at [place] of Stay Meetings, dates discussed:    Additional Comments:  Ninfa Meeker, RN 09/19/2018, 12:16 PM

## 2018-09-19 NOTE — Progress Notes (Signed)
Physical Therapy Treatment Patient Details Name: Jeffrey Shannon MRN: [id number] DOB: 11/22/1940 Today's Date: 09/19/2018    History of Present Illness Patient is 78 y/o male s/p R THA. PMH includes HTN and arthritis.     PT Comments    Patient seen for mobility progression. Pt is making progress toward PT goals and tolerated session well. Current plan remains appropriate.    Follow Up Recommendations  Follow surgeon's recommendation for DC plan and follow-up therapies     Equipment Recommendations  Rolling walker with 5" wheels    Recommendations for Other Services       Precautions / Restrictions Precautions Precautions: Fall Restrictions Weight Bearing Restrictions: Yes RLE Weight Bearing: Weight bearing as tolerated    Mobility  Bed Mobility Overal bed mobility: Modified Independent             General bed mobility comments: increased time and effort  Transfers Overall transfer level: Needs assistance Equipment used: Rolling walker (2 wheeled) Transfers: Sit to/from Stand Sit to Stand: Min guard         General transfer comment: cues for safe hand placement  Ambulation/Gait Ambulation/Gait assistance: Min guard Gait Distance (Feet): 150 Feet Assistive device: Rolling walker (2 wheeled) Gait Pattern/deviations: Decreased step length - left;Decreased stance time - right;Decreased stride length;Decreased weight shift to right;Step-through pattern Gait velocity: decreased   General Gait Details: cues for posture; improving step through pattern   Stairs Stairs: Yes Stairs assistance: Min guard;Min assist Stair Management: One rail Left;Step to pattern;Alternating pattern;Forwards Number of Stairs: 12 General stair comments: cues for sequencing and technique   Wheelchair Mobility    Modified Rankin (Stroke Patients Only)       Balance Overall balance assessment: Needs assistance Sitting-balance support: No upper extremity supported;Feet  supported Sitting balance-Leahy Scale: Good     Standing balance support: Bilateral upper extremity supported;During functional activity Standing balance-Leahy Scale: Fair                              Cognition Arousal/Alertness: Awake/alert Behavior During Therapy: WFL for tasks assessed/performed Overall Cognitive Status: Within Functional Limits for tasks assessed                                        Exercises      General Comments        Pertinent Vitals/Pain Pain Assessment: 0-10 Pain Score: 4  Pain Location: R hip Pain Descriptors / Indicators: Sore Pain Intervention(s): Limited activity within patient's tolerance;Monitored during session;Premedicated before session;Repositioned    Home Living                      Prior Function            PT Goals (current goals can now be found in the care plan section) Acute Rehab PT Goals Patient Stated Goal: go home Progress towards PT goals: Progressing toward goals    Frequency    7X/week      PT Plan Current plan remains appropriate    Co-evaluation              AM-PAC PT "6 Clicks" Mobility   Outcome Measure  Help needed turning from your back to your side while in a flat bed without using bedrails?: None Help needed moving from lying on your back to sitting on  the side of a flat bed without using bedrails?: None Help needed moving to and from a bed to a chair (including a wheelchair)?: A Little Help needed standing up from a chair using your arms (e.g., wheelchair or bedside chair)?: A Little Help needed to walk in hospital room?: A Little Help needed climbing 3-5 steps with a railing? : A Little 6 Click Score: 20    End of Session Equipment Utilized During Treatment: Gait belt Activity Tolerance: Patient tolerated treatment well Patient left: in chair;with call bell/phone within reach Nurse Communication: Mobility status PT Visit Diagnosis: Other  abnormalities of gait and mobility (R26.89);Muscle weakness (generalized) (M62.81);Difficulty in walking, not elsewhere classified (R26.2)     Time: 7371-0626 PT Time Calculation (min) (ACUTE ONLY): 33 min  Charges:  $Gait Training: 23-37 mins                     Earney Navy, PTA Acute Rehabilitation Services Pager: [phone] Office: [phone]     Darliss Cheney 09/19/2018, 9:59 AM

## 2018-09-19 NOTE — Progress Notes (Signed)
Subjective: 1 Day Post-Op Procedure(s) (LRB): RIGHT TOTAL HIP ARTHROPLASTY ANTERIOR APPROACH (Right) Patient reports pain as mild.    Objective: Vital signs in last 24 hours: Temp:  [97 F (36.1 C)-99.9 F (37.7 C)] 98.1 F (36.7 C) (03/10 0419) Pulse Rate:  [52-115] 86 (03/10 0419) Resp:  [11-18] 15 (03/10 0419) BP: (99-169)/(72-126) 105/75 (03/10 0419) SpO2:  [91 %-100 %] 91 % (03/10 0419) Weight:  [65.5 kg] 65.5 kg (03/09 1327)  Intake/Output from previous day: 03/09 0701 - 03/10 0700 In: 2630.4 [P.O.:540; I.V.:1690.4; IV Piggyback:400] Out: 1625 [Urine:1475; Blood:150] Intake/Output this shift: No intake/output data recorded.  Recent Labs    09/19/18 0257  HGB 12.6*   Recent Labs    09/19/18 0257  WBC 6.4  RBC 3.93*  HCT 38.1*  PLT 164   Recent Labs    09/19/18 0257  NA 135  K 3.9  CL 106  CO2 21*  BUN 14  CREATININE 1.43*  GLUCOSE 138*  CALCIUM 8.5*   No results for input(s): LABPT, INR in the last 72 hours.  Neurologically intact Neurovascular intact Sensation intact distally Intact pulses distally Dorsiflexion/Plantar flexion intact Incision: dressing C/D/I No cellulitis present Compartment soft   Assessment/Plan: 1 Day Post-Op Procedure(s) (LRB): RIGHT TOTAL HIP ARTHROPLASTY ANTERIOR APPROACH (Right) Advance diet Up with therapy D/C IV fluids Discharge home with home health after second PT session WBAT RLE ABLA- mild and stable Dry dressing change prn      Aundra Dubin 09/19/2018, 7:54 AM

## 2018-09-19 NOTE — Progress Notes (Signed)
Physical Therapy Treatment Patient Details Name: Jeffrey Shannon MRN: [id number] DOB: [birth year] Today's Date: 09/19/2018    History of Present Illness Patient is 78 y/o male s/p R THA. PMH includes HTN and arthritis.     PT Comments    Patient is making good progress with PT.  From a mobility standpoint anticipate patient will be ready for DC home when medically ready.    Follow Up Recommendations  Follow surgeon's recommendation for DC plan and follow-up therapies     Equipment Recommendations  Rolling walker with 5" wheels    Recommendations for Other Services       Precautions / Restrictions Precautions Precautions: Fall Restrictions Weight Bearing Restrictions: Yes RLE Weight Bearing: Weight bearing as tolerated    Mobility  Bed Mobility Overal bed mobility: Modified Independent             General bed mobility comments: pt OOB in chair upon arrival  Transfers Overall transfer level: Needs assistance Equipment used: Rolling walker (2 wheeled) Transfers: Sit to/from Stand Sit to Stand: Supervision         General transfer comment: supervision for safety  Ambulation/Gait Ambulation/Gait assistance: Min guard;Supervision Gait Distance (Feet): 120 Feet Assistive device: Rolling walker (2 wheeled) Gait Pattern/deviations: Decreased stride length;Decreased weight shift to right;Step-through pattern Gait velocity: decreased   General Gait Details: cues for posture and safe use of AD   Stairs Stairs: Yes Stairs assistance: Min guard;Min assist Stair Management: One rail Left;Step to pattern;Alternating pattern;Forwards Number of Stairs: 12 General stair comments: cues for sequencing and technique   Wheelchair Mobility    Modified Rankin (Stroke Patients Only)       Balance Overall balance assessment: Needs assistance Sitting-balance support: No upper extremity supported;Feet supported Sitting balance-Leahy Scale: Good     Standing balance  support: Bilateral upper extremity supported;During functional activity Standing balance-Leahy Scale: Fair                              Cognition Arousal/Alertness: Awake/alert Behavior During Therapy: WFL for tasks assessed/performed Overall Cognitive Status: Within Functional Limits for tasks assessed                                        Exercises Total Joint Exercises Short Arc Quad: AROM;Strengthening;10 reps Hip ABduction/ADduction: AROM;Strengthening;Right;Standing Long Arc Quad: AROM;Strengthening;Right;10 reps;Seated Knee Flexion: AROM;Strengthening;Right;10 reps;Standing Marching in Standing: AROM;Strengthening;Both;10 reps;Standing Standing Hip Extension: AROM;Right;10 reps;Standing    General Comments        Pertinent Vitals/Pain Pain Assessment: 0-10 Pain Score: 2  Pain Location: R hip Pain Descriptors / Indicators: Sore Pain Intervention(s): Limited activity within patient's tolerance;Monitored during session;Premedicated before session;Repositioned    Home Living                      Prior Function            PT Goals (current goals can now be found in the care plan section) Acute Rehab PT Goals Patient Stated Goal: go home Progress towards PT goals: Progressing toward goals    Frequency    7X/week      PT Plan Current plan remains appropriate    Co-evaluation              AM-PAC PT "6 Clicks" Mobility   Outcome Measure  Help needed turning from your back  to your side while in a flat bed without using bedrails?: None Help needed moving from lying on your back to sitting on the side of a flat bed without using bedrails?: None Help needed moving to and from a bed to a chair (including a wheelchair)?: A Little Help needed standing up from a chair using your arms (e.g., wheelchair or bedside chair)?: A Little Help needed to walk in hospital room?: A Little Help needed climbing 3-5 steps with a  railing? : A Little 6 Click Score: 20    End of Session Equipment Utilized During Treatment: Gait belt Activity Tolerance: Patient tolerated treatment well Patient left: in chair;with call bell/phone within reach;with family/visitor present Nurse Communication: Mobility status PT Visit Diagnosis: Other abnormalities of gait and mobility (R26.89);Muscle weakness (generalized) (M62.81);Difficulty in walking, not elsewhere classified (R26.2)     Time: 8115-7262 PT Time Calculation (min) (ACUTE ONLY): 21 min  Charges:   $Therapeutic Exercise: 8-22 mins                     Earney Navy, PTA Acute Rehabilitation Services Pager: [phone] Office: [phone]     Darliss Cheney 09/19/2018, 1:15 PM

## 2018-09-19 NOTE — Discharge Summary (Signed)
Patient ID: Jeffrey Shannon MRN: [id number] DOB/AGE: Feb 20, 1941 78 y.o.  Admit date: 09/18/2018 Discharge date: 09/19/2018  Admission Diagnoses:  Principal Problem:   Primary osteoarthritis of right hip Active Problems:   History of total hip replacement   Discharge Diagnoses:  Same  Past Medical History:  Diagnosis Date  . Arthritis   . Hypertension   . Pneumonia    January 2020    Surgeries: Procedure(s): RIGHT TOTAL HIP ARTHROPLASTY ANTERIOR APPROACH on 09/18/2018   Consultants:   Discharged Condition: Improved  Hospital Course: Jeffrey Shannon is an 78 y.o. male who was admitted 09/18/2018 for operative treatment ofPrimary osteoarthritis of right hip. Patient has severe unremitting pain that affects sleep, daily activities, and work/hobbies. After pre-op clearance the patient was taken to the operating room on 09/18/2018 and underwent  Procedure(s): RIGHT TOTAL HIP ARTHROPLASTY ANTERIOR APPROACH.    Patient was given perioperative antibiotics:  Anti-infectives (From admission, onward)   Start     Dose/Rate Route Frequency Ordered Stop   09/18/18 1330  ceFAZolin (ANCEF) IVPB 2g/100 mL premix     2 g 200 mL/hr over 30 Minutes Intravenous Every 6 hours 09/18/18 1315 09/19/18 0300   09/18/18 0900  ceFAZolin (ANCEF) IVPB 2g/100 mL premix     2 g 200 mL/hr over 30 Minutes Intravenous To ShortStay Surgical 09/15/18 0819 09/18/18 0837   09/18/18 0838  vancomycin (VANCOCIN) powder  Status:  Discontinued       As needed 09/18/18 0840 09/18/18 0929   09/18/18 0800  vancomycin (VANCOCIN) IVPB 1000 mg/200 mL premix     1,000 mg 200 mL/hr over 60 Minutes Intravenous To ShortStay Surgical 09/15/18 0177 09/18/18 0803       Patient was given sequential compression devices, early ambulation, and chemoprophylaxis to prevent DVT.  Patient benefited maximally from hospital stay and there were no complications.    Recent vital signs:  Patient Vitals for the past 24 hrs:  BP Temp Temp src  Pulse Resp SpO2 Height Weight  09/19/18 0419 105/75 98.1 F (36.7 C) Oral 86 15 91 % - -  09/19/18 0014 99/72 98.7 F (37.1 C) Oral 96 14 95 % - -  09/18/18 2042 (!) 113/98 98.8 F (37.1 C) Oral (!) 103 15 100 % - -  09/18/18 1749 116/73 - - (!) 115 18 97 % - -  09/18/18 1701 (!) 169/105 - - - - - - -  09/18/18 1618 (!) 169/105 - - - - - - -  09/18/18 1617 (!) 148/98 - - - - - - -  09/18/18 1615 (!) 169/126 - - - - - - -  09/18/18 1327 (!) 162/113 99.9 F (37.7 C) Axillary (!) 58 18 95 % 5\' 6"  (1.676 m) 65.5 kg  09/18/18 1306 (!) 154/97 (!) 97.4 F (36.3 C) - (!) 54 12 95 % - -  09/18/18 1233 (!) 153/95 - - (!) 55 14 100 % - -  09/18/18 1218 (!) 134/91 - - (!) 52 12 97 % - -  09/18/18 1203 (!) 144/91 - - 66 12 97 % - -  09/18/18 1148 (!) 144/95 - - (!) 59 16 95 % - -  09/18/18 1133 (!) 146/90 - - 60 13 98 % - -  09/18/18 1118 (!) 136/91 - - 72 12 100 % - -  09/18/18 1103 128/90 - - 62 16 99 % - -  09/18/18 1048 133/84 - - 67 13 99 % - -  09/18/18 1033 (!) 124/94 - -  65 12 92 % - -  09/18/18 1018 116/87 - - 67 13 96 % - -  09/18/18 1003 132/87 - - 64 11 98 % - -  09/18/18 0948 (!) 136/99 - - - 13 100 % - -  09/18/18 0933 125/90 (!) 97 F (36.1 C) - 60 12 98 % - -     Recent laboratory studies:  Recent Labs    09/19/18 0257  WBC 6.4  HGB 12.6*  HCT 38.1*  PLT 164  NA 135  K 3.9  CL 106  CO2 21*  BUN 14  CREATININE 1.43*  GLUCOSE 138*  CALCIUM 8.5*     Discharge Medications:   Allergies as of 09/19/2018   No Known Allergies     Medication List    TAKE these medications   amLODipine 10 MG tablet Commonly known as:  NORVASC Take 10 mg by mouth daily.   aspirin EC 81 MG tablet Take 1 tablet (81 mg total) by mouth 2 (two) times daily.   BENGAY EX Apply 1 application topically daily as needed (pain).   gabapentin 600 MG tablet Commonly known as:  NEURONTIN Take 1,200 mg by mouth at bedtime.   methocarbamol 750 MG tablet Commonly known as:   ROBAXIN Take 1 tablet (750 mg total) by mouth 2 (two) times daily as needed for muscle spasms.   ondansetron 4 MG tablet Commonly known as:  ZOFRAN Take 1-2 tablets (4-8 mg total) by mouth every 8 (eight) hours as needed for nausea or vomiting.   oxyCODONE 5 MG immediate release tablet Commonly known as:  Oxy IR/ROXICODONE Take 1-3 tablets (5-15 mg total) by mouth 3 (three) times daily as needed.   oxyCODONE 10 mg 12 hr tablet Commonly known as:  OXYCONTIN Take 1 tablet (10 mg total) by mouth every 12 (twelve) hours for 3 days.   promethazine 25 MG tablet Commonly known as:  PHENERGAN Take 1 tablet (25 mg total) by mouth every 6 (six) hours as needed for nausea.   senna-docusate 8.6-50 MG tablet Commonly known as:  Senokot S Take 1-2 tablets by mouth at bedtime as needed.            Durable Medical Equipment  (From admission, onward)         Start     Ordered   09/18/18 1316  DME Walker rolling  Once    Question:  Patient needs a walker to treat with the following condition  Answer:  History of hip replacement   09/18/18 1315   09/18/18 1316  DME 3 n 1  Once     09/18/18 1315   09/18/18 1316  DME Bedside commode  Once    Question:  Patient needs a bedside commode to treat with the following condition  Answer:  History of hip replacement   09/18/18 1315          Diagnostic Studies: Dg Pelvis Portable  Result Date: 09/18/2018 CLINICAL DATA:  78 year old male post right hip replacement. Subsequent encounter. EXAM: PORTABLE PELVIS 1-2 VIEWS COMPARISON:  09/18/2018 intraoperative C-arm view. 08/22/2018 preoperative pelvic film. FINDINGS: Post total right hip replacement which appears in satisfactory position without complication noted on frontal imaging. Mild left hip joint degenerative changes. IMPRESSION: Post right hip replacement. Electronically Signed   By: Genia Del M.D.   On: 09/18/2018 10:41   Dg C-arm 1-60 Min  Result Date: 09/18/2018 CLINICAL DATA:  Right  anterior total hip arthroplasty EXAM: DG C-ARM 61-120 MIN; OPERATIVE  RIGHT HIP WITH PELVIS COMPARISON:  08/22/18 FINDINGS: Status post right total hip arthroplasty. The hardware components are in anatomic alignment. No complicating features identified. No periprosthetic fracture or subluxation. IMPRESSION: Status post right total hip arthroplasty. Electronically Signed   By: Kerby Moors M.D.   On: 09/18/2018 09:15   Dg Hip Operative Unilat W Or W/o Pelvis Right  Result Date: 09/18/2018 CLINICAL DATA:  Right anterior total hip arthroplasty EXAM: DG C-ARM 61-120 MIN; OPERATIVE RIGHT HIP WITH PELVIS COMPARISON:  08/22/18 FINDINGS: Status post right total hip arthroplasty. The hardware components are in anatomic alignment. No complicating features identified. No periprosthetic fracture or subluxation. IMPRESSION: Status post right total hip arthroplasty. Electronically Signed   By: Kerby Moors M.D.   On: 09/18/2018 09:15   Xr Hip Unilat W Or W/o Pelvis 2-3 Views Right  Result Date: 08/22/2018 Advanced right hip degenerative joint disease with bone-on-bone joint space narrowing   Disposition: Discharge disposition: 01-Home or Self Care         Follow-up Information    Leandrew Koyanagi, MD In 2 weeks.   Specialty:  Orthopedic Surgery Why:  For suture removal, For wound re-check Contact information: [place] [id number] [phone]            Signed: Aundra Dubin 09/19/2018, 7:55 AM

## 2018-09-19 NOTE — Progress Notes (Signed)
Patient discharging home. Discharge instructions explained to patient and daughter and they both verbalized understanding. Took all personal belongings. No further questions or concerns voiced.  

## 2018-09-20 DIAGNOSIS — Z471 Aftercare following joint replacement surgery: Secondary | ICD-10-CM | POA: Diagnosis not present

## 2018-09-20 DIAGNOSIS — I10 Essential (primary) hypertension: Secondary | ICD-10-CM | POA: Diagnosis not present

## 2018-09-20 DIAGNOSIS — Z96641 Presence of right artificial hip joint: Secondary | ICD-10-CM | POA: Diagnosis not present

## 2018-09-20 DIAGNOSIS — Z7982 Long term (current) use of aspirin: Secondary | ICD-10-CM | POA: Diagnosis not present

## 2018-09-20 DIAGNOSIS — Z87891 Personal history of nicotine dependence: Secondary | ICD-10-CM | POA: Diagnosis not present

## 2018-09-20 DIAGNOSIS — M1612 Unilateral primary osteoarthritis, left hip: Secondary | ICD-10-CM | POA: Diagnosis not present

## 2018-09-21 ENCOUNTER — Telehealth (INDEPENDENT_AMBULATORY_CARE_PROVIDER_SITE_OTHER): Payer: Self-pay | Admitting: Orthopaedic Surgery

## 2018-09-21 NOTE — Telephone Encounter (Signed)
Jeffrey Shannon with [facility] left a message yesterday requesting VO for PT.  She did not leave the frequency.  CB#[phone].  Thank you.

## 2018-09-21 NOTE — Telephone Encounter (Signed)
Frequency- twice this week 3 times next week. Once a week the following week.

## 2018-09-27 ENCOUNTER — Telehealth (INDEPENDENT_AMBULATORY_CARE_PROVIDER_SITE_OTHER): Payer: Self-pay | Admitting: Orthopaedic Surgery

## 2018-09-27 NOTE — Telephone Encounter (Signed)
Melody -(PT) called advised there is a change of frequency for HHPT to 2 Wk 3. The number to contact Melody is [phone]

## 2018-09-28 NOTE — Telephone Encounter (Signed)
Called no answer LMOM with details. Okay on orders.

## 2018-10-02 ENCOUNTER — Telehealth (INDEPENDENT_AMBULATORY_CARE_PROVIDER_SITE_OTHER): Payer: Self-pay

## 2018-10-02 NOTE — Telephone Encounter (Signed)
Called patient and asked the screening questions.  Do you have now or have you had in the past 7 days a fever and/or chills? Chills yesterday but went away. Post op patient.  Do you have now or have you had in the past 7 days a cough? NO  Do you have now or have you had in the last 7 days nausea, vomiting or abdominal pain? NO  Have you been exposed to anyone who has tested positive for COVID-19? NO  Have you or anyone who lives with you traveled within the last month? NO

## 2018-10-03 ENCOUNTER — Other Ambulatory Visit: Payer: Self-pay

## 2018-10-03 ENCOUNTER — Ambulatory Visit (INDEPENDENT_AMBULATORY_CARE_PROVIDER_SITE_OTHER): Payer: PPO | Admitting: Physician Assistant

## 2018-10-03 ENCOUNTER — Encounter (INDEPENDENT_AMBULATORY_CARE_PROVIDER_SITE_OTHER): Payer: Self-pay | Admitting: Orthopaedic Surgery

## 2018-10-03 DIAGNOSIS — Z96641 Presence of right artificial hip joint: Secondary | ICD-10-CM

## 2018-10-03 MED ORDER — HYDROCODONE-ACETAMINOPHEN 5-325 MG PO TABS: 1.0000 | ORAL_TABLET | Freq: Four times a day (QID) | ORAL | 0 refills | Status: AC | PRN

## 2018-10-03 NOTE — Progress Notes (Signed)
   Post-Op Visit Note   Patient: Jeffrey Shannon           Date of Birth: 29-May-1941           MRN: [id number] Visit Date: 10/03/2018 PCP: Benito Mccreedy, MD   Assessment & Plan:  Chief Complaint:  Chief Complaint  Patient presents with  . Right Hip - Pain   Visit Diagnoses:  1. Status post total hip replacement, right     Plan: Patient is a pleasant 78 year old gentleman who presents our clinic today 15 days status post right anterior total hip replacement, date of surgery 09/18/2018.  He has been doing excellent.  Minimal pain.  He has recently finished his home health physical therapy and has done excellent.  No fevers or chills.  Examination of his right hip reveals well-healed surgical incision without complication.  Calf is soft and nontender.  He is neurovascular intact distally.  At this point, he will continue working on his home exercise program.  Dental prophylaxis reinforced.  He will follow-up with [place] in 4 weeks time for repeat evaluation and x-rays.  Call with concerns or questions in the meantime.  Follow-Up Instructions: Return in about 4 weeks (around 10/31/2018).   Orders:  No orders of the defined types were placed in this encounter.  No orders of the defined types were placed in this encounter.   Imaging: No new imaging  PMFS History: Patient Active Problem List   Diagnosis Date Noted  . Primary osteoarthritis of right hip 09/18/2018  . Status post total hip replacement, right 09/18/2018   Past Medical History:  Diagnosis Date  . Arthritis   . Hypertension   . Pneumonia    January 2020    History reviewed. No pertinent family history.  Past Surgical History:  Procedure Laterality Date  . COLONOSCOPY    . TOTAL HIP ARTHROPLASTY Right 09/18/2018   Procedure: RIGHT TOTAL HIP ARTHROPLASTY ANTERIOR APPROACH;  Surgeon: Leandrew Koyanagi, MD;  Location: [facility];  Service: Orthopedics;  Laterality: Right;   Social History   Occupational History  . Not on file   Tobacco Use  . Smoking status: Former Smoker    Last attempt to quit: 09/08/2007    Years since quitting: 11.0  . Smokeless tobacco: Never Used  Substance and Sexual Activity  . Alcohol use: Yes    Comment: occasionally  . Drug use: Never  . Sexual activity: Not on file

## 2018-10-27 DIAGNOSIS — G629 Polyneuropathy, unspecified: Secondary | ICD-10-CM | POA: Diagnosis not present

## 2018-10-27 DIAGNOSIS — R1013 Epigastric pain: Secondary | ICD-10-CM | POA: Diagnosis not present

## 2018-10-27 DIAGNOSIS — Z01118 Encounter for examination of ears and hearing with other abnormal findings: Secondary | ICD-10-CM | POA: Diagnosis not present

## 2018-10-27 DIAGNOSIS — Z720 Tobacco use: Secondary | ICD-10-CM | POA: Diagnosis not present

## 2018-10-27 DIAGNOSIS — Z0001 Encounter for general adult medical examination with abnormal findings: Secondary | ICD-10-CM | POA: Diagnosis not present

## 2018-10-27 DIAGNOSIS — Z1389 Encounter for screening for other disorder: Secondary | ICD-10-CM | POA: Diagnosis not present

## 2018-10-27 DIAGNOSIS — Z136 Encounter for screening for cardiovascular disorders: Secondary | ICD-10-CM | POA: Diagnosis not present

## 2018-10-27 DIAGNOSIS — E7800 Pure hypercholesterolemia, unspecified: Secondary | ICD-10-CM | POA: Diagnosis not present

## 2018-10-27 DIAGNOSIS — I119 Hypertensive heart disease without heart failure: Secondary | ICD-10-CM | POA: Diagnosis not present

## 2018-10-27 DIAGNOSIS — R7303 Prediabetes: Secondary | ICD-10-CM | POA: Diagnosis not present

## 2018-10-27 DIAGNOSIS — E559 Vitamin D deficiency, unspecified: Secondary | ICD-10-CM | POA: Diagnosis not present

## 2018-10-27 DIAGNOSIS — I10 Essential (primary) hypertension: Secondary | ICD-10-CM | POA: Diagnosis not present

## 2018-10-27 DIAGNOSIS — Z1329 Encounter for screening for other suspected endocrine disorder: Secondary | ICD-10-CM | POA: Diagnosis not present

## 2018-10-27 DIAGNOSIS — N183 Chronic kidney disease, stage 3 (moderate): Secondary | ICD-10-CM | POA: Diagnosis not present

## 2018-10-27 DIAGNOSIS — Z131 Encounter for screening for diabetes mellitus: Secondary | ICD-10-CM | POA: Diagnosis not present

## 2018-10-31 ENCOUNTER — Ambulatory Visit (INDEPENDENT_AMBULATORY_CARE_PROVIDER_SITE_OTHER): Payer: PPO | Admitting: Orthopaedic Surgery

## 2018-12-27 DIAGNOSIS — N183 Chronic kidney disease, stage 3 (moderate): Secondary | ICD-10-CM | POA: Diagnosis not present

## 2018-12-27 DIAGNOSIS — E7800 Pure hypercholesterolemia, unspecified: Secondary | ICD-10-CM | POA: Diagnosis not present

## 2018-12-27 DIAGNOSIS — R1013 Epigastric pain: Secondary | ICD-10-CM | POA: Diagnosis not present

## 2018-12-27 DIAGNOSIS — G629 Polyneuropathy, unspecified: Secondary | ICD-10-CM | POA: Diagnosis not present

## 2018-12-27 DIAGNOSIS — R7303 Prediabetes: Secondary | ICD-10-CM | POA: Diagnosis not present

## 2018-12-27 DIAGNOSIS — E559 Vitamin D deficiency, unspecified: Secondary | ICD-10-CM | POA: Diagnosis not present

## 2018-12-27 DIAGNOSIS — I10 Essential (primary) hypertension: Secondary | ICD-10-CM | POA: Diagnosis not present

## 2018-12-27 DIAGNOSIS — Z720 Tobacco use: Secondary | ICD-10-CM | POA: Diagnosis not present

## 2018-12-27 DIAGNOSIS — I119 Hypertensive heart disease without heart failure: Secondary | ICD-10-CM | POA: Diagnosis not present

## 2019-02-09 ENCOUNTER — Other Ambulatory Visit: Payer: Self-pay

## 2019-06-29 DIAGNOSIS — R05 Cough: Secondary | ICD-10-CM | POA: Diagnosis not present

## 2019-06-29 DIAGNOSIS — E7800 Pure hypercholesterolemia, unspecified: Secondary | ICD-10-CM | POA: Diagnosis not present

## 2019-06-29 DIAGNOSIS — I119 Hypertensive heart disease without heart failure: Secondary | ICD-10-CM | POA: Diagnosis not present

## 2019-06-29 DIAGNOSIS — E559 Vitamin D deficiency, unspecified: Secondary | ICD-10-CM | POA: Diagnosis not present

## 2019-06-29 DIAGNOSIS — R7303 Prediabetes: Secondary | ICD-10-CM | POA: Diagnosis not present

## 2019-06-29 DIAGNOSIS — Z720 Tobacco use: Secondary | ICD-10-CM | POA: Diagnosis not present

## 2019-06-29 DIAGNOSIS — G629 Polyneuropathy, unspecified: Secondary | ICD-10-CM | POA: Diagnosis not present

## 2019-06-29 DIAGNOSIS — I10 Essential (primary) hypertension: Secondary | ICD-10-CM | POA: Diagnosis not present

## 2019-06-29 DIAGNOSIS — R1013 Epigastric pain: Secondary | ICD-10-CM | POA: Diagnosis not present

## 2019-06-29 DIAGNOSIS — N1831 Chronic kidney disease, stage 3a: Secondary | ICD-10-CM | POA: Diagnosis not present

## 2019-06-29 DIAGNOSIS — Z23 Encounter for immunization: Secondary | ICD-10-CM | POA: Diagnosis not present

## 2019-06-29 DIAGNOSIS — D649 Anemia, unspecified: Secondary | ICD-10-CM | POA: Diagnosis not present

## 2019-07-10 ENCOUNTER — Encounter (HOSPITAL_BASED_OUTPATIENT_CLINIC_OR_DEPARTMENT_OTHER): Payer: Self-pay | Admitting: *Deleted

## 2019-07-10 ENCOUNTER — Other Ambulatory Visit: Payer: Self-pay

## 2019-07-10 ENCOUNTER — Emergency Department (HOSPITAL_BASED_OUTPATIENT_CLINIC_OR_DEPARTMENT_OTHER)
Admission: EM | Admit: 2019-07-10 | Discharge: 2019-07-10 | Disposition: A | Discharge status: Home / Self Care | Payer: PPO | Attending: Emergency Medicine | Admitting: Emergency Medicine

## 2019-07-10 ENCOUNTER — Emergency Department (HOSPITAL_BASED_OUTPATIENT_CLINIC_OR_DEPARTMENT_OTHER): Payer: PPO

## 2019-07-10 DIAGNOSIS — J189 Pneumonia, unspecified organism: Secondary | ICD-10-CM | POA: Diagnosis not present

## 2019-07-10 DIAGNOSIS — E7800 Pure hypercholesterolemia, unspecified: Secondary | ICD-10-CM | POA: Diagnosis not present

## 2019-07-10 DIAGNOSIS — E559 Vitamin D deficiency, unspecified: Secondary | ICD-10-CM | POA: Diagnosis not present

## 2019-07-10 DIAGNOSIS — Z87891 Personal history of nicotine dependence: Secondary | ICD-10-CM | POA: Diagnosis not present

## 2019-07-10 DIAGNOSIS — Z96641 Presence of right artificial hip joint: Secondary | ICD-10-CM | POA: Insufficient documentation

## 2019-07-10 DIAGNOSIS — D649 Anemia, unspecified: Secondary | ICD-10-CM | POA: Diagnosis not present

## 2019-07-10 DIAGNOSIS — R1013 Epigastric pain: Secondary | ICD-10-CM | POA: Diagnosis not present

## 2019-07-10 DIAGNOSIS — I119 Hypertensive heart disease without heart failure: Secondary | ICD-10-CM | POA: Diagnosis not present

## 2019-07-10 DIAGNOSIS — Z7982 Long term (current) use of aspirin: Secondary | ICD-10-CM | POA: Diagnosis not present

## 2019-07-10 DIAGNOSIS — I10 Essential (primary) hypertension: Secondary | ICD-10-CM | POA: Insufficient documentation

## 2019-07-10 DIAGNOSIS — R7303 Prediabetes: Secondary | ICD-10-CM | POA: Diagnosis not present

## 2019-07-10 DIAGNOSIS — U071 COVID-19: Secondary | ICD-10-CM | POA: Insufficient documentation

## 2019-07-10 DIAGNOSIS — G629 Polyneuropathy, unspecified: Secondary | ICD-10-CM | POA: Diagnosis not present

## 2019-07-10 DIAGNOSIS — Z79899 Other long term (current) drug therapy: Secondary | ICD-10-CM | POA: Diagnosis not present

## 2019-07-10 DIAGNOSIS — R05 Cough: Secondary | ICD-10-CM | POA: Diagnosis not present

## 2019-07-10 DIAGNOSIS — Z720 Tobacco use: Secondary | ICD-10-CM | POA: Diagnosis not present

## 2019-07-10 DIAGNOSIS — N1831 Chronic kidney disease, stage 3a: Secondary | ICD-10-CM | POA: Diagnosis not present

## 2019-07-10 DIAGNOSIS — R918 Other nonspecific abnormal finding of lung field: Secondary | ICD-10-CM | POA: Diagnosis not present

## 2019-07-10 DIAGNOSIS — R5383 Other fatigue: Secondary | ICD-10-CM | POA: Diagnosis present

## 2019-07-10 LAB — URINALYSIS, ROUTINE W REFLEX MICROSCOPIC
Bilirubin Urine: NEGATIVE
Glucose, UA: NEGATIVE mg/dL
Hgb urine dipstick: NEGATIVE
Ketones, ur: NEGATIVE mg/dL
Leukocytes,Ua: NEGATIVE
Nitrite: NEGATIVE
Protein, ur: 100 mg/dL — AB
Specific Gravity, Urine: >1.03 — ABNORMAL HIGH (ref 1.005–1.030)
pH: 5.5 (ref 5.0–8.0)

## 2019-07-10 LAB — URINALYSIS, MICROSCOPIC (REFLEX)

## 2019-07-10 LAB — BASIC METABOLIC PANEL
Anion gap: 13 (ref 5–15)
BUN: 45 mg/dL — ABNORMAL HIGH (ref 8–23)
CO2: 21 mmol/L — ABNORMAL LOW (ref 22–32)
Calcium: 9.5 mg/dL (ref 8.9–10.3)
Chloride: 107 mmol/L (ref 98–111)
Creatinine, Ser: 1.83 mg/dL — ABNORMAL HIGH (ref 0.61–1.24)
GFR calc Af Amer: 41 mL/min — ABNORMAL LOW (ref >60)
GFR calc non Af Amer: 35 mL/min — ABNORMAL LOW (ref >60)
Glucose, Bld: 121 mg/dL — ABNORMAL HIGH (ref 70–99)
Potassium: 5.4 mmol/L — ABNORMAL HIGH (ref 3.5–5.1)
Sodium: 141 mmol/L (ref 135–145)

## 2019-07-10 LAB — CBC
HCT: 53.6 % — ABNORMAL HIGH (ref 39.0–52.0)
Hemoglobin: 17.2 g/dL — ABNORMAL HIGH (ref 13.0–17.0)
MCH: 32.7 pg (ref 26.0–34.0)
MCHC: 32.1 g/dL (ref 30.0–36.0)
MCV: 101.9 fL — ABNORMAL HIGH (ref 80.0–100.0)
Platelets: 284 10*3/uL (ref 150–400)
RBC: 5.26 MIL/uL (ref 4.22–5.81)
RDW: 11.9 % (ref 11.5–15.5)
WBC: 4.5 10*3/uL (ref 4.0–10.5)
nRBC: 0 % (ref 0.0–0.2)

## 2019-07-10 MED ORDER — DOXYCYCLINE HYCLATE 100 MG PO CAPS: 100.0000 mg | ORAL_CAPSULE | Freq: Two times a day (BID) | ORAL | 0 refills | Status: AC

## 2019-07-10 MED ORDER — SODIUM CHLORIDE 0.9 % IV BOLUS: 1000.0000 mL | Freq: Once | INTRAVENOUS | Status: DC

## 2019-07-10 NOTE — ED Notes (Signed)
Body aches and HA present

## 2019-07-10 NOTE — Discharge Instructions (Addendum)
Please take antibiotic as prescribed.  Recommend recheck with your primary doctor regarding the symptoms you are experiencing today.  Please discuss your mildly elevated potassium level.  This should be rechecked within the next week.  If you have vomiting, difficulty in breathing, chest pain, fever, return to ER for reassessment.

## 2019-07-10 NOTE — ED Notes (Signed)
Jeffrey Shannon - daughter - [phone]

## 2019-07-10 NOTE — ED Triage Notes (Signed)
Chills, body aches, headache and fatigue for a week. He had a negative Covid test a week ago.

## 2019-07-10 NOTE — ED Provider Notes (Signed)
[facility] EMERGENCY DEPARTMENT Provider Note   CSN: [id number] Arrival date & time: 07/10/19  1127     History Chief Complaint  Patient presents with  . Covid Symptoms    Jeffrey Shannon is a 78 y.o. male.  Presents today for department with multiple complaints.  Patient states for the past couple weeks he has felt generally ill, has had decreased energy, increased fatigue.  Has noted some body aches, occasional dull mild headache.  States 1 week ago he had a negative Covid test.  No shortness of breath, no chest pain.  No dysuria or hematuria.  No abdominal pain or vomiting.  States his primary doctor gave him a Z-Pak which she completed.  This did not improve his symptoms.  HPI     Past Medical History:  Diagnosis Date  . Arthritis   . Hypertension   . Pneumonia    January 2020    Patient Active Problem List   Diagnosis Date Noted  . Primary osteoarthritis of right hip 09/18/2018  . Status post total hip replacement, right 09/18/2018    Past Surgical History:  Procedure Laterality Date  . COLONOSCOPY    . TOTAL HIP ARTHROPLASTY Right 09/18/2018   Procedure: RIGHT TOTAL HIP ARTHROPLASTY ANTERIOR APPROACH;  Surgeon: Leandrew Koyanagi, MD;  Location: [facility];  Service: Orthopedics;  Laterality: Right;       No family history on file.  Social History   Tobacco Use  . Smoking status: Former Smoker    Quit date: 09/08/2007    Years since quitting: 11.8  . Smokeless tobacco: Never Used  Substance Use Topics  . Alcohol use: Yes    Comment: occasionally  . Drug use: Never    Home Medications Prior to Admission medications   Medication Sig Start Date End Date Taking? Authorizing Provider  amLODipine (NORVASC) 10 MG tablet Take 10 mg by mouth daily. 05/16/18  Yes [provider]  aspirin EC 81 MG tablet Take 1 tablet (81 mg total) by mouth 2 (two) times daily. 09/18/18   Leandrew Koyanagi, MD  doxycycline (VIBRAMYCIN) 100 MG capsule Take 1 capsule (100 mg  total) by mouth 2 (two) times daily for 7 days. 07/10/19 07/17/19  Lucrezia Starch, MD  gabapentin (NEURONTIN) 600 MG tablet Take 1,200 mg by mouth at bedtime.  12/01/17   [provider]  HYDROcodone-acetaminophen (NORCO) 5-325 MG tablet Take 1 tablet by mouth every 6 (six) hours as needed for moderate pain. 10/03/18   Aundra Dubin, PA-C  Menthol, Topical Analgesic, (BENGAY EX) Apply 1 application topically daily as needed (pain).    [provider]  methocarbamol (ROBAXIN) 750 MG tablet Take 1 tablet (750 mg total) by mouth 2 (two) times daily as needed for muscle spasms. 09/18/18   Leandrew Koyanagi, MD  ondansetron (ZOFRAN) 4 MG tablet Take 1-2 tablets (4-8 mg total) by mouth every 8 (eight) hours as needed for nausea or vomiting. 09/18/18   Leandrew Koyanagi, MD  oxyCODONE (OXY IR/ROXICODONE) 5 MG immediate release tablet Take 1-3 tablets (5-15 mg total) by mouth 3 (three) times daily as needed. 09/18/18   Leandrew Koyanagi, MD  promethazine (PHENERGAN) 25 MG tablet Take 1 tablet (25 mg total) by mouth every 6 (six) hours as needed for nausea. 09/18/18   Leandrew Koyanagi, MD  senna-docusate (SENOKOT S) 8.6-50 MG tablet Take 1-2 tablets by mouth at bedtime as needed. 09/18/18   Leandrew Koyanagi, MD  Allergies    Patient has no known allergies.  Review of Systems   Review of Systems  Constitutional: Positive for chills and fatigue. Negative for fever.  HENT: Negative for ear pain and sore throat.   Eyes: Negative for pain and visual disturbance.  Respiratory: Negative for cough and shortness of breath.   Cardiovascular: Negative for chest pain and palpitations.  Gastrointestinal: Negative for abdominal pain and vomiting.  Genitourinary: Negative for dysuria and hematuria.  Musculoskeletal: Positive for myalgias. Negative for arthralgias and back pain.  Skin: Negative for color change and rash.  Neurological: Negative for seizures and syncope.  All other systems reviewed and are negative.    Physical Exam Updated Vital Signs BP 122/89   Pulse 97   Temp 97.6 F (36.4 C) (Oral)   Resp 20   SpO2 96%   Physical Exam Vitals and nursing note reviewed.  Constitutional:      Appearance: He is well-developed.  HENT:     Head: Normocephalic and atraumatic.  Eyes:     Conjunctiva/sclera: Conjunctivae normal.  Cardiovascular:     Rate and Rhythm: Normal rate and regular rhythm.     Heart sounds: No murmur.  Pulmonary:     Effort: Pulmonary effort is normal. No respiratory distress.     Breath sounds: Normal breath sounds.  Abdominal:     Palpations: Abdomen is soft.     Tenderness: There is no abdominal tenderness.  Musculoskeletal:        General: No swelling or tenderness.     Cervical back: Neck supple.  Skin:    General: Skin is warm and dry.     Capillary Refill: Capillary refill takes less than 2 seconds.  Neurological:     General: No focal deficit present.     Mental Status: He is alert and oriented to person, place, and time.     ED Results / Procedures / Treatments   Labs (all labs ordered are listed, but only abnormal results are displayed) Labs Reviewed  BASIC METABOLIC PANEL - Abnormal; Notable for the following components:      Result Value   Potassium 5.4 (*)    CO2 21 (*)    Glucose, Bld 121 (*)    BUN 45 (*)    Creatinine, Ser 1.83 (*)    GFR calc non Af Amer 35 (*)    GFR calc Af Amer 41 (*)    All other components within normal limits  CBC - Abnormal; Notable for the following components:   Hemoglobin 17.2 (*)    HCT 53.6 (*)    MCV 101.9 (*)    All other components within normal limits  URINALYSIS, ROUTINE W REFLEX MICROSCOPIC - Abnormal; Notable for the following components:   Color, Urine AMBER (*)    Specific Gravity, Urine >1.030 (*)    Protein, ur 100 (*)    All other components within normal limits  URINALYSIS, MICROSCOPIC (REFLEX) - Abnormal; Notable for the following components:   Bacteria, UA FEW (*)    All other  components within normal limits  SARS CORONAVIRUS 2 (TAT 6-24 HRS)    EKG EKG Interpretation  Date/Time:  Tuesday July 10 2019 15:48:32 EST Ventricular Rate:  89 PR Interval:    QRS Duration: 104 QT Interval:  377 QTC Calculation: 459 R Axis:   -66 Text Interpretation: Sinus rhythm Incomplete RBBB and LAFB Abnormal R-wave progression, late transition Baseline wander in lead(s) V4 V5 Confirmed by Madalyn Rob [phone]) on 07/10/2019  6:32:49 PM   Radiology DG Chest Portable 1 View  Result Date: 07/10/2019 CLINICAL DATA:  Chills. EXAM: PORTABLE CHEST 1 VIEW COMPARISON:  None. FINDINGS: The heart size and mediastinal contours are within normal limits. No pneumothorax or pleural effusion is noted. Mild ill-defined bilateral lung opacities are noted concerning for multifocal pneumonia. The visualized skeletal structures are unremarkable. IMPRESSION: Mild ill-defined bilateral lung opacities are noted concerning for multifocal pneumonia. Follow-up radiographs are recommended until resolution. Electronically Signed   By: Marijo Conception M.D.   On: 07/10/2019 15:51    Procedures Procedures (including critical care time)  Medications Ordered in ED Medications - No data to display  ED Course  I have reviewed the triage vital signs and the nursing notes.  Pertinent labs & imaging results that were available during my care of the patient were reviewed by me and considered in my medical decision making (see chart for details).    MDM Rules/Calculators/A&P                      78 year old male presents to ER with 2 weeks of fatigue, malaise, body aches.  CXR concerning for possible pneumonia.  Patient is currently well-appearing, afebrile, no tachypnea or hypoxia.  He has completed his Z-Pak already.  Given persistence of symptoms, will give additional course of abx with doxycycline.  Labs were notable for slight increase from prior Cr and borderline hyperkalemia.  No EKG changes.   Patient encouraged PO fluids. Tolerating without difficulty in ER. Discussed in detail with patient. Recommend recheck with primary doctor.  He reports being tested negative for Covid, will send additional test today.  Reviewed precautions, will discharge home.    After the discussed management above, the patient was determined to be safe for discharge.  The patient was in agreement with this plan and all questions regarding their care were answered.  ED return precautions were discussed and the patient will return to the ED with any significant worsening of condition.    Final Clinical Impression(s) / ED Diagnoses Final diagnoses:  Community acquired pneumonia, unspecified laterality    Rx / DC Orders ED Discharge Orders         Ordered    doxycycline (VIBRAMYCIN) 100 MG capsule  2 times daily     07/10/19 1936           Lucrezia Starch, MD 07/10/19 [phone]

## 2019-07-11 LAB — SARS CORONAVIRUS 2 (TAT 6-24 HRS): SARS Coronavirus 2: POSITIVE — AB

## 2019-07-17 DIAGNOSIS — E559 Vitamin D deficiency, unspecified: Secondary | ICD-10-CM | POA: Diagnosis not present

## 2019-07-17 DIAGNOSIS — Z720 Tobacco use: Secondary | ICD-10-CM | POA: Diagnosis not present

## 2019-07-17 DIAGNOSIS — E7800 Pure hypercholesterolemia, unspecified: Secondary | ICD-10-CM | POA: Diagnosis not present

## 2019-07-17 DIAGNOSIS — U071 COVID-19: Secondary | ICD-10-CM | POA: Diagnosis not present

## 2019-07-17 DIAGNOSIS — D649 Anemia, unspecified: Secondary | ICD-10-CM | POA: Diagnosis not present

## 2019-07-17 DIAGNOSIS — I10 Essential (primary) hypertension: Secondary | ICD-10-CM | POA: Diagnosis not present

## 2019-07-17 DIAGNOSIS — N1831 Chronic kidney disease, stage 3a: Secondary | ICD-10-CM | POA: Diagnosis not present

## 2019-07-17 DIAGNOSIS — R7303 Prediabetes: Secondary | ICD-10-CM | POA: Diagnosis not present

## 2019-07-17 DIAGNOSIS — R1013 Epigastric pain: Secondary | ICD-10-CM | POA: Diagnosis not present

## 2019-07-17 DIAGNOSIS — I119 Hypertensive heart disease without heart failure: Secondary | ICD-10-CM | POA: Diagnosis not present

## 2019-07-17 DIAGNOSIS — G629 Polyneuropathy, unspecified: Secondary | ICD-10-CM | POA: Diagnosis not present

## 2019-07-27 ENCOUNTER — Emergency Department (HOSPITAL_BASED_OUTPATIENT_CLINIC_OR_DEPARTMENT_OTHER)
Admission: EM | Admit: 2019-07-27 | Discharge: 2019-07-27 | Disposition: A | Discharge status: Home / Self Care | Payer: PPO | Attending: Emergency Medicine | Admitting: Emergency Medicine

## 2019-07-27 ENCOUNTER — Emergency Department (HOSPITAL_BASED_OUTPATIENT_CLINIC_OR_DEPARTMENT_OTHER): Payer: PPO

## 2019-07-27 ENCOUNTER — Other Ambulatory Visit: Payer: Self-pay

## 2019-07-27 ENCOUNTER — Encounter (HOSPITAL_BASED_OUTPATIENT_CLINIC_OR_DEPARTMENT_OTHER): Payer: Self-pay | Admitting: *Deleted

## 2019-07-27 DIAGNOSIS — Z79899 Other long term (current) drug therapy: Secondary | ICD-10-CM | POA: Diagnosis not present

## 2019-07-27 DIAGNOSIS — R05 Cough: Secondary | ICD-10-CM | POA: Diagnosis not present

## 2019-07-27 DIAGNOSIS — J1282 Pneumonia due to coronavirus disease 2019: Secondary | ICD-10-CM

## 2019-07-27 DIAGNOSIS — Z87891 Personal history of nicotine dependence: Secondary | ICD-10-CM | POA: Diagnosis not present

## 2019-07-27 DIAGNOSIS — Z7982 Long term (current) use of aspirin: Secondary | ICD-10-CM | POA: Insufficient documentation

## 2019-07-27 DIAGNOSIS — J1289 Other viral pneumonia: Secondary | ICD-10-CM | POA: Diagnosis not present

## 2019-07-27 DIAGNOSIS — U071 COVID-19: Secondary | ICD-10-CM | POA: Insufficient documentation

## 2019-07-27 DIAGNOSIS — I10 Essential (primary) hypertension: Secondary | ICD-10-CM | POA: Diagnosis not present

## 2019-07-27 DIAGNOSIS — R0602 Shortness of breath: Secondary | ICD-10-CM | POA: Diagnosis not present

## 2019-07-27 LAB — COMPREHENSIVE METABOLIC PANEL
ALT: 15 U/L (ref 0–44)
AST: 26 U/L (ref 15–41)
Albumin: 3.5 g/dL (ref 3.5–5.0)
Alkaline Phosphatase: 89 U/L (ref 38–126)
Anion gap: 9 (ref 5–15)
BUN: 18 mg/dL (ref 8–23)
CO2: 23 mmol/L (ref 22–32)
Calcium: 8.9 mg/dL (ref 8.9–10.3)
Chloride: 108 mmol/L (ref 98–111)
Creatinine, Ser: 1.26 mg/dL — ABNORMAL HIGH (ref 0.61–1.24)
GFR calc Af Amer: >60 mL/min (ref >60)
GFR calc non Af Amer: 55 mL/min — ABNORMAL LOW (ref >60)
Glucose, Bld: 86 mg/dL (ref 70–99)
Potassium: 4.2 mmol/L (ref 3.5–5.1)
Sodium: 140 mmol/L (ref 135–145)
Total Bilirubin: 0.6 mg/dL (ref 0.3–1.2)
Total Protein: 7 g/dL (ref 6.5–8.1)

## 2019-07-27 LAB — CBC WITH DIFFERENTIAL/PLATELET
Abs Immature Granulocytes: 0.01 10*3/uL (ref 0.00–0.07)
Basophils Absolute: 0 10*3/uL (ref 0.0–0.1)
Basophils Relative: 0 %
Eosinophils Absolute: 0.2 10*3/uL (ref 0.0–0.5)
Eosinophils Relative: 3 %
HCT: 42.1 % (ref 39.0–52.0)
Hemoglobin: 13.5 g/dL (ref 13.0–17.0)
Immature Granulocytes: 0 %
Lymphocytes Relative: 33 %
Lymphs Abs: 1.5 10*3/uL (ref 0.7–4.0)
MCH: 32.5 pg (ref 26.0–34.0)
MCHC: 32.1 g/dL (ref 30.0–36.0)
MCV: 101.2 fL — ABNORMAL HIGH (ref 80.0–100.0)
Monocytes Absolute: 0.7 10*3/uL (ref 0.1–1.0)
Monocytes Relative: 15 %
Neutro Abs: 2.2 10*3/uL (ref 1.7–7.7)
Neutrophils Relative %: 49 %
Platelets: 203 10*3/uL (ref 150–400)
RBC: 4.16 MIL/uL — ABNORMAL LOW (ref 4.22–5.81)
RDW: 12.7 % (ref 11.5–15.5)
WBC: 4.6 10*3/uL (ref 4.0–10.5)
nRBC: 0 % (ref 0.0–0.2)

## 2019-07-27 LAB — LACTATE DEHYDROGENASE: LDH: 251 U/L — ABNORMAL HIGH (ref 98–192)

## 2019-07-27 LAB — C-REACTIVE PROTEIN: CRP: 1 mg/dL — ABNORMAL HIGH (ref <1.0)

## 2019-07-27 LAB — TROPONIN I (HIGH SENSITIVITY): Troponin I (High Sensitivity): 5 ng/L (ref <18)

## 2019-07-27 LAB — FERRITIN: Ferritin: 220 ng/mL (ref 24–336)

## 2019-07-27 LAB — TRIGLYCERIDES: Triglycerides: 71 mg/dL (ref <150)

## 2019-07-27 LAB — D-DIMER, QUANTITATIVE: D-Dimer, Quant: 1.54 ug/mL-FEU — ABNORMAL HIGH (ref 0.00–0.50)

## 2019-07-27 LAB — BRAIN NATRIURETIC PEPTIDE: B Natriuretic Peptide: 114.6 pg/mL — ABNORMAL HIGH (ref 0.0–100.0)

## 2019-07-27 LAB — LACTIC ACID, PLASMA: Lactic Acid, Venous: 1.6 mmol/L (ref 0.5–1.9)

## 2019-07-27 LAB — FIBRINOGEN: Fibrinogen: 483 mg/dL — ABNORMAL HIGH (ref 210–475)

## 2019-07-27 LAB — PROCALCITONIN: Procalcitonin: <0.1 ng/mL

## 2019-07-27 MED ORDER — IOHEXOL 350 MG/ML SOLN
100.0000 mL | Freq: Once | INTRAVENOUS | Status: AC | PRN
  Administered 2019-07-27: 100 mL via INTRAVENOUS

## 2019-07-27 MED ORDER — DEXAMETHASONE SODIUM PHOSPHATE 10 MG/ML IJ SOLN
10.0000 mg | Freq: Once | INTRAMUSCULAR | Status: AC
  Administered 2019-07-27: 10 mg via INTRAVENOUS
  Filled 2019-07-27: qty 1

## 2019-07-27 MED ORDER — ACETAMINOPHEN 325 MG PO TABS
650.0000 mg | ORAL_TABLET | Freq: Once | ORAL | Status: AC
  Administered 2019-07-27: 650 mg via ORAL
  Filled 2019-07-27: qty 2

## 2019-07-27 MED ORDER — ALBUTEROL SULFATE HFA 108 (90 BASE) MCG/ACT IN AERS
4.0000 | INHALATION_SPRAY | Freq: Once | RESPIRATORY_TRACT | Status: AC
  Administered 2019-07-27: 4 via RESPIRATORY_TRACT
  Filled 2019-07-27: qty 6.7

## 2019-07-27 NOTE — ED Triage Notes (Signed)
Pt was covid positive last month, SOB x 1 month

## 2019-07-27 NOTE — ED Provider Notes (Signed)
[facility] EMERGENCY DEPARTMENT Provider Note   CSN: [id number] Arrival date & time: 07/27/19  1435     History Chief Complaint  Patient presents with  . Shortness of Breath    Jeffrey Shannon is a 79 y.o. male.  HPI Patient had a positive SARS coronavirus 2 test +12\29\2020.  He reports he is feeling worse over the past several days.  He is having a lot of chills but has not documented a fever.  He reports he does feel achy.  Short of breath with minor activity.  He denies shortness of breath while sleeping or supine.  He denies chest pain.  He denies significant amount of cough.  Reports he feels fatigued and does not feel good.  Reports he has had a headache particularly somewhat more on the right side of his head that shoots down the side of his face.  He denies he specifically noted any swelling in the legs or pain behind the calves.  Patient is a distant ex-smoker.  He reports " feeling worse and something is wrong."  Patient reports he is compliant with all of his prescribed medications.  He is denying abdominal pain nausea vomiting or diarrhea.  Denying decreased urinary output or other urinary symptoms.  Reports he is urinating twice a day.    Past Medical History:  Diagnosis Date  . Arthritis   . Hypertension   . Pneumonia    January 2020    Patient Active Problem List   Diagnosis Date Noted  . Primary osteoarthritis of right hip 09/18/2018  . Status post total hip replacement, right 09/18/2018    Past Surgical History:  Procedure Laterality Date  . COLONOSCOPY    . TOTAL HIP ARTHROPLASTY Right 09/18/2018   Procedure: RIGHT TOTAL HIP ARTHROPLASTY ANTERIOR APPROACH;  Surgeon: Leandrew Koyanagi, MD;  Location: [facility];  Service: Orthopedics;  Laterality: Right;       History reviewed. No pertinent family history.  Social History   Tobacco Use  . Smoking status: Former Smoker    Quit date: 09/08/2007    Years since quitting: 11.8  . Smokeless tobacco: Never  Used  Substance Use Topics  . Alcohol use: Yes    Comment: occasionally  . Drug use: Never    Home Medications Prior to Admission medications   Medication Sig Start Date End Date Taking? Authorizing Provider  amLODipine (NORVASC) 10 MG tablet Take 10 mg by mouth daily. 05/16/18   [provider]  aspirin EC 81 MG tablet Take 1 tablet (81 mg total) by mouth 2 (two) times daily. 09/18/18   Leandrew Koyanagi, MD  gabapentin (NEURONTIN) 600 MG tablet Take 1,200 mg by mouth at bedtime.  12/01/17   [provider]  HYDROcodone-acetaminophen (NORCO) 5-325 MG tablet Take 1 tablet by mouth every 6 (six) hours as needed for moderate pain. 10/03/18   Aundra Dubin, PA-C  Menthol, Topical Analgesic, (BENGAY EX) Apply 1 application topically daily as needed (pain).    [provider]  methocarbamol (ROBAXIN) 750 MG tablet Take 1 tablet (750 mg total) by mouth 2 (two) times daily as needed for muscle spasms. 09/18/18   Leandrew Koyanagi, MD  ondansetron (ZOFRAN) 4 MG tablet Take 1-2 tablets (4-8 mg total) by mouth every 8 (eight) hours as needed for nausea or vomiting. 09/18/18   Leandrew Koyanagi, MD  oxyCODONE (OXY IR/ROXICODONE) 5 MG immediate release tablet Take 1-3 tablets (5-15 mg total) by mouth 3 (three) times daily  as needed. 09/18/18   Leandrew Koyanagi, MD  promethazine (PHENERGAN) 25 MG tablet Take 1 tablet (25 mg total) by mouth every 6 (six) hours as needed for nausea. 09/18/18   Leandrew Koyanagi, MD  senna-docusate (SENOKOT S) 8.6-50 MG tablet Take 1-2 tablets by mouth at bedtime as needed. 09/18/18   Leandrew Koyanagi, MD    Allergies    Patient has no known allergies.  Review of Systems   Review of Systems 10 Systems reviewed and are negative for acute change except as noted in the HPI.  Physical Exam Updated Vital Signs BP (!) 151/122   Pulse 78   Temp (!) 97.5 F (36.4 C)   Resp (!) 26   Ht 5\' 6"  (1.676 m)   Wt 67.6 kg   SpO2 (!) 88%   BMI 24.05 kg/m   Physical  Exam Constitutional:      Comments: Patient is alert and nontoxic.  Clinically, no respiratory distress at rest.  Mental status is clear.  Well-nourished well-developed.  HENT:     Head: Normocephalic and atraumatic.  Eyes:     Extraocular Movements: Extraocular movements intact.  Cardiovascular:     Rate and Rhythm: Normal rate and regular rhythm.  Pulmonary:     Comments: No respiratory distress at rest.  Patient has expiratory wheeze essentially at bases. Abdominal:     General: There is no distension.     Palpations: Abdomen is soft.     Tenderness: There is no abdominal tenderness. There is no guarding.  Musculoskeletal:        General: No swelling or tenderness. Normal range of motion.     Comments: Feet are warm and dry.  Condition of feet ankles and lower legs is very good.  Calves are soft and nontender.  No peripheral edema.  No wounds.  No significant skin stenting or changes of significant chronic venous stasis  Skin:    General: Skin is warm and dry.  Neurological:     General: No focal deficit present.     Mental Status: He is oriented to person, place, and time.     Coordination: Coordination normal.  Psychiatric:        Mood and Affect: Mood normal.     ED Results / Procedures / Treatments   Labs (all labs ordered are listed, but only abnormal results are displayed) Labs Reviewed  CBC WITH DIFFERENTIAL/PLATELET - Abnormal; Notable for the following components:      Result Value   RBC 4.16 (*)    MCV 101.2 (*)    All other components within normal limits  CULTURE, BLOOD (ROUTINE X 2)  CULTURE, BLOOD (ROUTINE X 2)  COMPREHENSIVE METABOLIC PANEL  LACTIC ACID, PLASMA  LACTIC ACID, PLASMA  BRAIN NATRIURETIC PEPTIDE  D-DIMER, QUANTITATIVE (NOT AT [facility])  PROCALCITONIN  LACTATE DEHYDROGENASE  FERRITIN  TRIGLYCERIDES  FIBRINOGEN  C-REACTIVE PROTEIN  TROPONIN I (HIGH SENSITIVITY)    EKG EKG Interpretation  Date/Time:  Friday July 27 2019 14:50:17  EST Ventricular Rate:  83 PR Interval:    QRS Duration: 93 QT Interval:  398 QTC Calculation: 468 R Axis:   -79 Text Interpretation: Sinus rhythm similar to previous except some lateral t wave flattening Confirmed by Charlesetta Shanks [phone]) on 07/27/2019 3:20:25 PM   Radiology No results found.  Procedures Procedures (including critical care time)  Medications Ordered in ED Medications - No data to display  ED Course  I have reviewed the triage vital signs and  the nursing notes.  Pertinent labs & imaging results that were available during my care of the patient were reviewed by me and considered in my medical decision making (see chart for details).    MDM Rules/Calculators/A&P                     Patient reports he had Covid symptoms starting approximately 3 weeks ago.  He has been treated with both Zithromax and doxycycline over the course of this.  He reports he thought he would be feeling better by now.  He does not think he is actually getting worse he just thought that his symptoms should be more improved at this point.  Patient does not want to be admitted to the hospital and does not want to go to [facility] for inpatient management.  At this time, his oxygen saturation is stable at rest in the high 90s, patient is not tachycardic or dyspneic at rest.  CT scan was obtained to rule out any other sequelae such as PE or appearance of bacterial pneumonia.  This does not appear to be the case based on CT scan.  Thus, at this time I do think his symptoms continue to be due to COVID-19 pneumonia.  He does appear to be tolerating this well at this time.  As the patient does not want to be admitted, I will give him 1 dose of IV Decadron in the emergency department and recommend that he be evaluated to determine if he is a candidate for outpatient remdesivir.  He is advised to call his PCP for continued home monitoring and infectious disease clinic for evaluation for Covid and  outpatient therapies.  Return precautions reviewed.  Final Clinical Impression(s) / ED Diagnoses Final diagnoses:  Pneumonia due to COVID-19 virus    Rx / DC Orders ED Discharge Orders    None       Charlesetta Shanks, MD 07/27/19 [phone]

## 2019-07-27 NOTE — Discharge Instructions (Signed)
1.  You have been given a dose of steroids in the emergency department.  Your symptoms are likely due to continued presence of Covid pneumonia.  If your breathing becomes more difficult, your shortness of breath is increasing, you develop confusion or other concerning symptoms return to the emergency department immediately. 2.  Call your family doctor for recheck as soon as possible.  Also call the number of the infectious disease clinic listed in your discharge instructions to request evaluation for possible infusion of medications for Covid on outpatient basis.

## 2019-07-27 NOTE — ED Notes (Signed)
Patient transported to CT 

## 2019-08-01 ENCOUNTER — Telehealth: Payer: Self-pay

## 2019-08-01 LAB — CULTURE, BLOOD (ROUTINE X 2)
Culture: NO GROWTH
Culture: NO GROWTH
Special Requests: ADEQUATE

## 2019-08-01 NOTE — Telephone Encounter (Signed)
Noted patient was in ER recently with symptoms of > 3 weeks duration due to COVID. He did not meet criteria for admission but was offered due to lingering symptoms.   He is not a candidate for outpatient monoclonal antibody infusion treatment with symptoms > 10 days in duration. Will advise the patient to continue to follow up with Primary care provider and consider outpatient evaluation with [facility].   I was not able to reach the patient - Message left to call back.

## 2019-08-01 NOTE — Telephone Encounter (Signed)
Patient was recently in the ED and was instructed to follow up outpatient therapies for evaluation of covid. Do this patient need referral with infusion center? Jeffrey Shannon

## 2019-08-01 NOTE — Telephone Encounter (Signed)
The ER should be calling the monoclonal antibody hotline for referral. I will contact the patient to discuss with him as he is not a candidate to receive it based on symptom onset.  Thank you

## 2019-08-06 ENCOUNTER — Inpatient Hospital Stay: Payer: PPO | Admitting: Internal Medicine

## 2019-09-04 DIAGNOSIS — M25422 Effusion, left elbow: Secondary | ICD-10-CM | POA: Diagnosis not present

## 2019-09-04 DIAGNOSIS — W5581XA Bitten by other mammals, initial encounter: Secondary | ICD-10-CM | POA: Diagnosis not present

## 2019-09-04 DIAGNOSIS — R58 Hemorrhage, not elsewhere classified: Secondary | ICD-10-CM | POA: Diagnosis not present

## 2019-09-04 DIAGNOSIS — M19022 Primary osteoarthritis, left elbow: Secondary | ICD-10-CM | POA: Diagnosis not present

## 2019-09-04 DIAGNOSIS — R52 Pain, unspecified: Secondary | ICD-10-CM | POA: Diagnosis not present

## 2019-09-04 DIAGNOSIS — R0902 Hypoxemia: Secondary | ICD-10-CM | POA: Diagnosis not present

## 2019-09-04 DIAGNOSIS — Y998 Other external cause status: Secondary | ICD-10-CM | POA: Diagnosis not present

## 2019-09-04 DIAGNOSIS — W540XXA Bitten by dog, initial encounter: Secondary | ICD-10-CM | POA: Diagnosis not present

## 2019-09-04 DIAGNOSIS — S59902A Unspecified injury of left elbow, initial encounter: Secondary | ICD-10-CM | POA: Diagnosis not present

## 2019-09-04 DIAGNOSIS — S41132A Puncture wound without foreign body of left upper arm, initial encounter: Secondary | ICD-10-CM | POA: Diagnosis not present

## 2019-09-07 DIAGNOSIS — E559 Vitamin D deficiency, unspecified: Secondary | ICD-10-CM | POA: Diagnosis not present

## 2019-09-07 DIAGNOSIS — Z125 Encounter for screening for malignant neoplasm of prostate: Secondary | ICD-10-CM | POA: Diagnosis not present

## 2019-09-07 DIAGNOSIS — I10 Essential (primary) hypertension: Secondary | ICD-10-CM | POA: Diagnosis not present

## 2019-09-07 DIAGNOSIS — I119 Hypertensive heart disease without heart failure: Secondary | ICD-10-CM | POA: Diagnosis not present

## 2019-09-07 DIAGNOSIS — Z0001 Encounter for general adult medical examination with abnormal findings: Secondary | ICD-10-CM | POA: Diagnosis not present

## 2019-09-07 DIAGNOSIS — N1831 Chronic kidney disease, stage 3a: Secondary | ICD-10-CM | POA: Diagnosis not present

## 2019-09-07 DIAGNOSIS — E7800 Pure hypercholesterolemia, unspecified: Secondary | ICD-10-CM | POA: Diagnosis not present

## 2019-09-07 DIAGNOSIS — Z7689 Persons encountering health services in other specified circumstances: Secondary | ICD-10-CM | POA: Diagnosis not present

## 2019-09-07 DIAGNOSIS — G629 Polyneuropathy, unspecified: Secondary | ICD-10-CM | POA: Diagnosis not present

## 2019-09-07 DIAGNOSIS — R7303 Prediabetes: Secondary | ICD-10-CM | POA: Diagnosis not present

## 2019-09-07 DIAGNOSIS — J1282 Pneumonia due to coronavirus disease 2019: Secondary | ICD-10-CM | POA: Diagnosis not present

## 2019-09-07 DIAGNOSIS — Z720 Tobacco use: Secondary | ICD-10-CM | POA: Diagnosis not present

## 2019-09-07 DIAGNOSIS — R1013 Epigastric pain: Secondary | ICD-10-CM | POA: Diagnosis not present

## 2019-10-01 DIAGNOSIS — R7303 Prediabetes: Secondary | ICD-10-CM | POA: Diagnosis not present

## 2019-10-01 DIAGNOSIS — R1013 Epigastric pain: Secondary | ICD-10-CM | POA: Diagnosis not present

## 2019-10-01 DIAGNOSIS — E7800 Pure hypercholesterolemia, unspecified: Secondary | ICD-10-CM | POA: Diagnosis not present

## 2019-10-01 DIAGNOSIS — J1282 Pneumonia due to coronavirus disease 2019: Secondary | ICD-10-CM | POA: Diagnosis not present

## 2019-10-01 DIAGNOSIS — Z720 Tobacco use: Secondary | ICD-10-CM | POA: Diagnosis not present

## 2019-10-01 DIAGNOSIS — E7211 Homocystinuria: Secondary | ICD-10-CM | POA: Diagnosis not present

## 2019-10-01 DIAGNOSIS — I10 Essential (primary) hypertension: Secondary | ICD-10-CM | POA: Diagnosis not present

## 2019-10-01 DIAGNOSIS — E559 Vitamin D deficiency, unspecified: Secondary | ICD-10-CM | POA: Diagnosis not present

## 2019-10-01 DIAGNOSIS — N1831 Chronic kidney disease, stage 3a: Secondary | ICD-10-CM | POA: Diagnosis not present

## 2019-10-01 DIAGNOSIS — I119 Hypertensive heart disease without heart failure: Secondary | ICD-10-CM | POA: Diagnosis not present

## 2019-10-01 DIAGNOSIS — G629 Polyneuropathy, unspecified: Secondary | ICD-10-CM | POA: Diagnosis not present

## 2019-12-03 DIAGNOSIS — Z1211 Encounter for screening for malignant neoplasm of colon: Secondary | ICD-10-CM | POA: Diagnosis not present

## 2019-12-03 DIAGNOSIS — R143 Flatulence: Secondary | ICD-10-CM | POA: Diagnosis not present

## 2019-12-03 DIAGNOSIS — K5900 Constipation, unspecified: Secondary | ICD-10-CM | POA: Diagnosis not present

## 2019-12-27 DIAGNOSIS — K5730 Diverticulosis of large intestine without perforation or abscess without bleeding: Secondary | ICD-10-CM | POA: Diagnosis not present

## 2019-12-27 DIAGNOSIS — K635 Polyp of colon: Secondary | ICD-10-CM | POA: Diagnosis not present

## 2019-12-27 DIAGNOSIS — D123 Benign neoplasm of transverse colon: Secondary | ICD-10-CM | POA: Diagnosis not present

## 2019-12-27 DIAGNOSIS — Z1211 Encounter for screening for malignant neoplasm of colon: Secondary | ICD-10-CM | POA: Diagnosis not present

## 2020-01-12 IMAGING — RF OPERATIVE RIGHT HIP WITH PELVIS
1 series · 2 of 2 positions shown · non-contrast
Comparison: 08/22/18

CLINICAL DATA: Right anterior total hip arthroplasty

EXAM:
DG C-ARM 61-120 MIN; OPERATIVE RIGHT HIP WITH PELVIS

[Series 1: unknown protocol · 0.20mm/px · 2 of 2 slices shown]
[im 1/2]
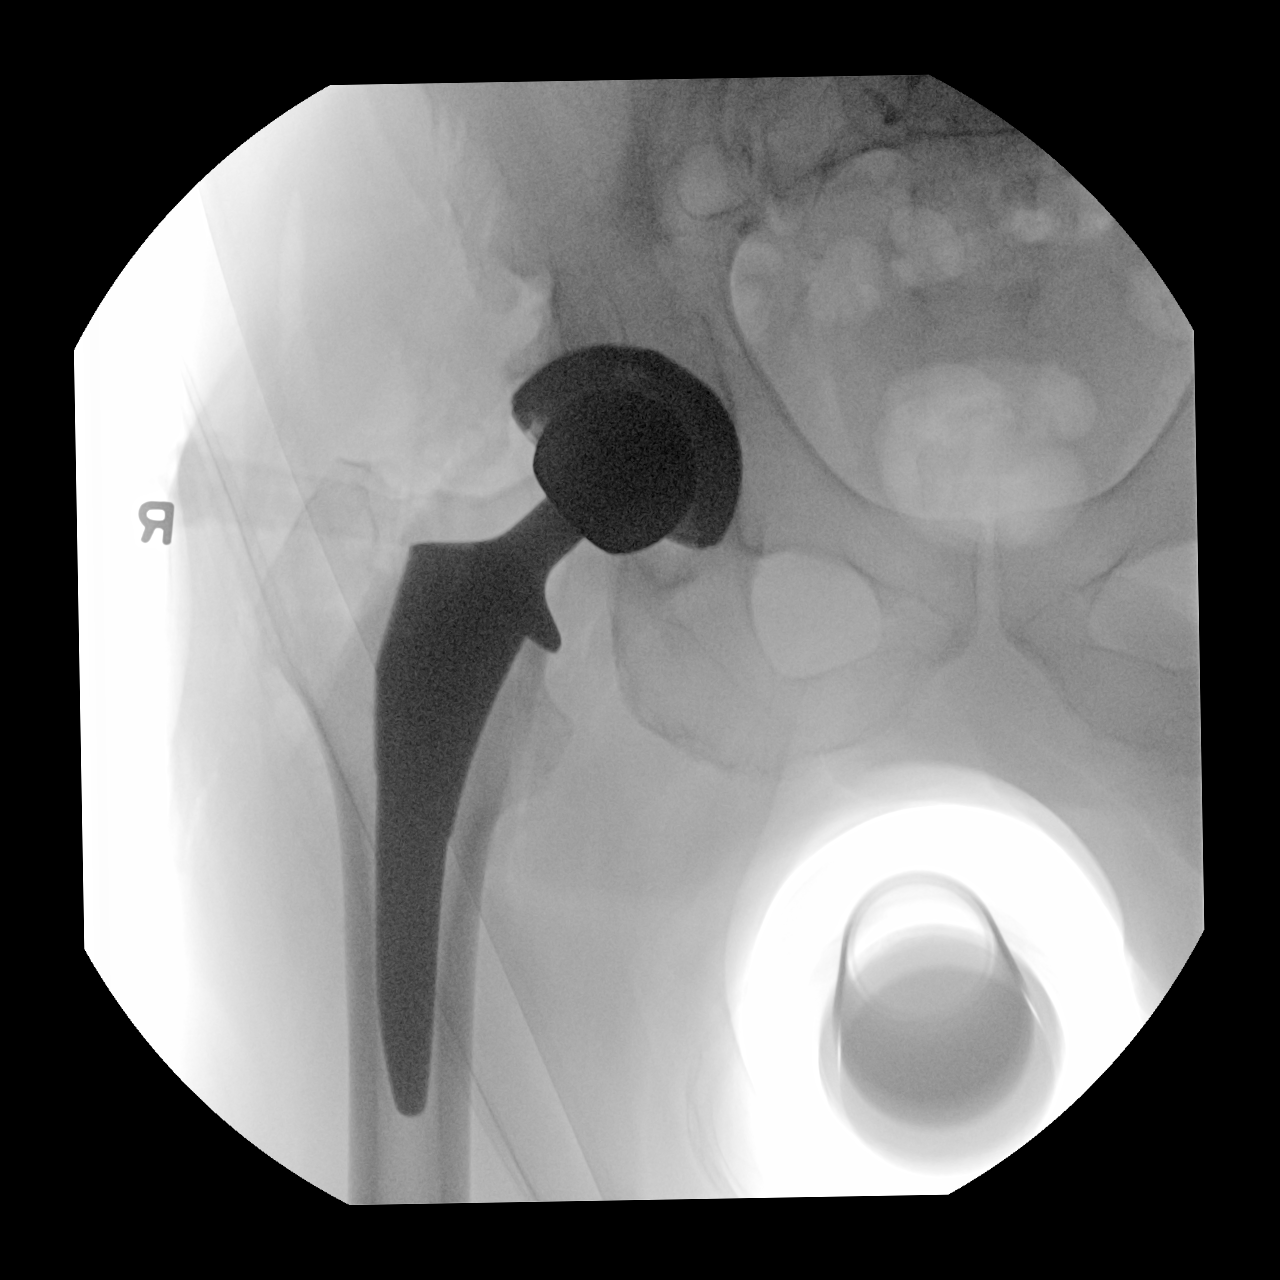
[im 2/2]
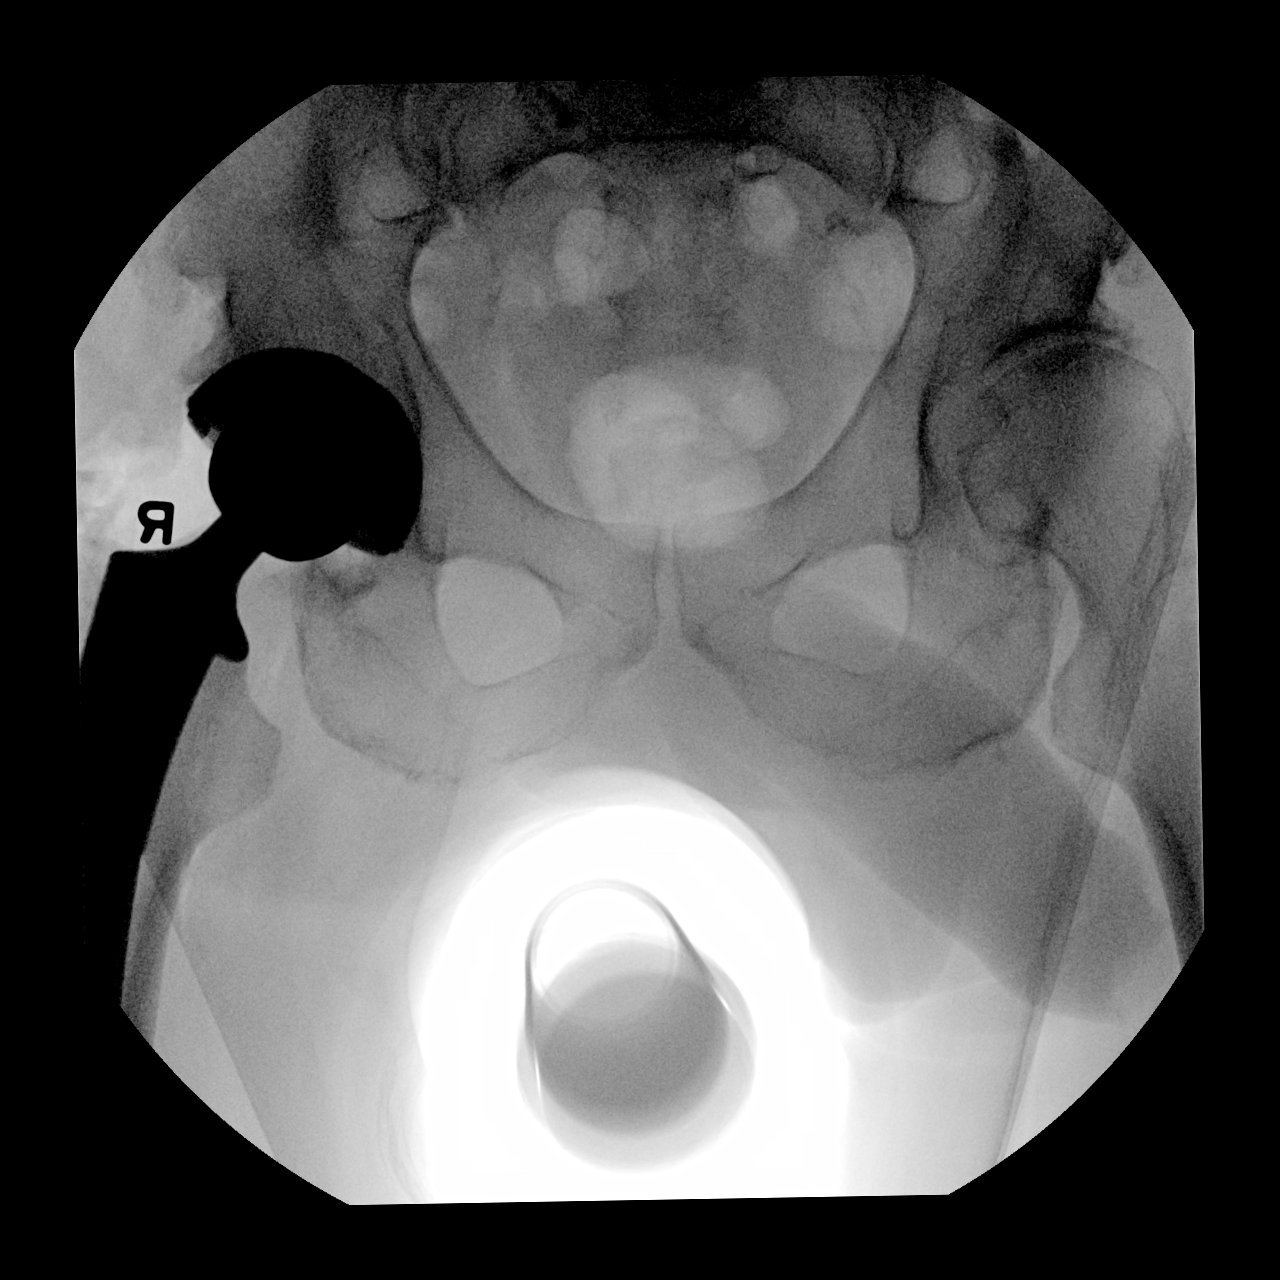

[2 of 2 positions shown; findings below may reference images not displayed]

FINDINGS: Status post right total hip arthroplasty. The hardware components
are in anatomic alignment. No complicating features identified. No
periprosthetic fracture or subluxation.
IMPRESSION: Status post right total hip arthroplasty.

## 2020-02-01 DIAGNOSIS — N1831 Chronic kidney disease, stage 3a: Secondary | ICD-10-CM | POA: Diagnosis not present

## 2020-02-01 DIAGNOSIS — Z0001 Encounter for general adult medical examination with abnormal findings: Secondary | ICD-10-CM | POA: Diagnosis not present

## 2020-02-01 DIAGNOSIS — Z131 Encounter for screening for diabetes mellitus: Secondary | ICD-10-CM | POA: Diagnosis not present

## 2020-02-01 DIAGNOSIS — E7211 Homocystinuria: Secondary | ICD-10-CM | POA: Diagnosis not present

## 2020-02-01 DIAGNOSIS — Z720 Tobacco use: Secondary | ICD-10-CM | POA: Diagnosis not present

## 2020-02-01 DIAGNOSIS — Z125 Encounter for screening for malignant neoplasm of prostate: Secondary | ICD-10-CM | POA: Diagnosis not present

## 2020-02-01 DIAGNOSIS — R1013 Epigastric pain: Secondary | ICD-10-CM | POA: Diagnosis not present

## 2020-02-01 DIAGNOSIS — J1282 Pneumonia due to coronavirus disease 2019: Secondary | ICD-10-CM | POA: Diagnosis not present

## 2020-02-01 DIAGNOSIS — E559 Vitamin D deficiency, unspecified: Secondary | ICD-10-CM | POA: Diagnosis not present

## 2020-02-01 DIAGNOSIS — I119 Hypertensive heart disease without heart failure: Secondary | ICD-10-CM | POA: Diagnosis not present

## 2020-02-01 DIAGNOSIS — Z136 Encounter for screening for cardiovascular disorders: Secondary | ICD-10-CM | POA: Diagnosis not present

## 2020-02-01 DIAGNOSIS — R7303 Prediabetes: Secondary | ICD-10-CM | POA: Diagnosis not present

## 2020-03-19 DIAGNOSIS — R1013 Epigastric pain: Secondary | ICD-10-CM | POA: Diagnosis not present

## 2020-03-19 DIAGNOSIS — E7800 Pure hypercholesterolemia, unspecified: Secondary | ICD-10-CM | POA: Diagnosis not present

## 2020-03-19 DIAGNOSIS — N1831 Chronic kidney disease, stage 3a: Secondary | ICD-10-CM | POA: Diagnosis not present

## 2020-03-19 DIAGNOSIS — J1282 Pneumonia due to coronavirus disease 2019: Secondary | ICD-10-CM | POA: Diagnosis not present

## 2020-03-19 DIAGNOSIS — I10 Essential (primary) hypertension: Secondary | ICD-10-CM | POA: Diagnosis not present

## 2020-03-19 DIAGNOSIS — M7052 Other bursitis of knee, left knee: Secondary | ICD-10-CM | POA: Diagnosis not present

## 2020-03-19 DIAGNOSIS — R7303 Prediabetes: Secondary | ICD-10-CM | POA: Diagnosis not present

## 2020-03-19 DIAGNOSIS — Z720 Tobacco use: Secondary | ICD-10-CM | POA: Diagnosis not present

## 2020-03-19 DIAGNOSIS — G629 Polyneuropathy, unspecified: Secondary | ICD-10-CM | POA: Diagnosis not present

## 2020-03-19 DIAGNOSIS — E559 Vitamin D deficiency, unspecified: Secondary | ICD-10-CM | POA: Diagnosis not present

## 2020-03-19 DIAGNOSIS — E7211 Homocystinuria: Secondary | ICD-10-CM | POA: Diagnosis not present

## 2020-03-19 DIAGNOSIS — I119 Hypertensive heart disease without heart failure: Secondary | ICD-10-CM | POA: Diagnosis not present

## 2020-05-06 DIAGNOSIS — M7062 Trochanteric bursitis, left hip: Secondary | ICD-10-CM | POA: Diagnosis not present

## 2020-05-06 DIAGNOSIS — M25552 Pain in left hip: Secondary | ICD-10-CM | POA: Diagnosis not present

## 2020-06-04 DIAGNOSIS — N183 Chronic kidney disease, stage 3 unspecified: Secondary | ICD-10-CM | POA: Diagnosis not present

## 2020-06-04 DIAGNOSIS — I129 Hypertensive chronic kidney disease with stage 1 through stage 4 chronic kidney disease, or unspecified chronic kidney disease: Secondary | ICD-10-CM | POA: Diagnosis not present

## 2020-06-17 DIAGNOSIS — E7800 Pure hypercholesterolemia, unspecified: Secondary | ICD-10-CM | POA: Diagnosis not present

## 2020-06-17 DIAGNOSIS — G629 Polyneuropathy, unspecified: Secondary | ICD-10-CM | POA: Diagnosis not present

## 2020-06-17 DIAGNOSIS — E7211 Homocystinuria: Secondary | ICD-10-CM | POA: Diagnosis not present

## 2020-06-17 DIAGNOSIS — R7303 Prediabetes: Secondary | ICD-10-CM | POA: Diagnosis not present

## 2020-06-17 DIAGNOSIS — E559 Vitamin D deficiency, unspecified: Secondary | ICD-10-CM | POA: Diagnosis not present

## 2020-06-17 DIAGNOSIS — I119 Hypertensive heart disease without heart failure: Secondary | ICD-10-CM | POA: Diagnosis not present

## 2020-06-17 DIAGNOSIS — Z720 Tobacco use: Secondary | ICD-10-CM | POA: Diagnosis not present

## 2020-06-17 DIAGNOSIS — I10 Essential (primary) hypertension: Secondary | ICD-10-CM | POA: Diagnosis not present

## 2020-06-17 DIAGNOSIS — N1831 Chronic kidney disease, stage 3a: Secondary | ICD-10-CM | POA: Diagnosis not present

## 2020-06-17 DIAGNOSIS — J1282 Pneumonia due to coronavirus disease 2019: Secondary | ICD-10-CM | POA: Diagnosis not present

## 2020-06-17 DIAGNOSIS — R1013 Epigastric pain: Secondary | ICD-10-CM | POA: Diagnosis not present

## 2020-09-08 DIAGNOSIS — G629 Polyneuropathy, unspecified: Secondary | ICD-10-CM | POA: Diagnosis not present

## 2020-09-08 DIAGNOSIS — Z720 Tobacco use: Secondary | ICD-10-CM | POA: Diagnosis not present

## 2020-09-08 DIAGNOSIS — E7211 Homocystinuria: Secondary | ICD-10-CM | POA: Diagnosis not present

## 2020-09-08 DIAGNOSIS — R7303 Prediabetes: Secondary | ICD-10-CM | POA: Diagnosis not present

## 2020-09-08 DIAGNOSIS — I10 Essential (primary) hypertension: Secondary | ICD-10-CM | POA: Diagnosis not present

## 2020-09-08 DIAGNOSIS — E7800 Pure hypercholesterolemia, unspecified: Secondary | ICD-10-CM | POA: Diagnosis not present

## 2020-09-08 DIAGNOSIS — E559 Vitamin D deficiency, unspecified: Secondary | ICD-10-CM | POA: Diagnosis not present

## 2020-09-08 DIAGNOSIS — N1831 Chronic kidney disease, stage 3a: Secondary | ICD-10-CM | POA: Diagnosis not present

## 2020-09-08 DIAGNOSIS — I119 Hypertensive heart disease without heart failure: Secondary | ICD-10-CM | POA: Diagnosis not present

## 2020-09-08 DIAGNOSIS — Z0000 Encounter for general adult medical examination without abnormal findings: Secondary | ICD-10-CM | POA: Diagnosis not present

## 2020-09-08 DIAGNOSIS — R1013 Epigastric pain: Secondary | ICD-10-CM | POA: Diagnosis not present

## 2020-10-16 DIAGNOSIS — Z720 Tobacco use: Secondary | ICD-10-CM | POA: Diagnosis not present

## 2020-10-16 DIAGNOSIS — E559 Vitamin D deficiency, unspecified: Secondary | ICD-10-CM | POA: Diagnosis not present

## 2020-10-16 DIAGNOSIS — N1831 Chronic kidney disease, stage 3a: Secondary | ICD-10-CM | POA: Diagnosis not present

## 2020-10-16 DIAGNOSIS — E7211 Homocystinuria: Secondary | ICD-10-CM | POA: Diagnosis not present

## 2020-10-16 DIAGNOSIS — E7800 Pure hypercholesterolemia, unspecified: Secondary | ICD-10-CM | POA: Diagnosis not present

## 2020-10-16 DIAGNOSIS — I10 Essential (primary) hypertension: Secondary | ICD-10-CM | POA: Diagnosis not present

## 2020-10-16 DIAGNOSIS — R1013 Epigastric pain: Secondary | ICD-10-CM | POA: Diagnosis not present

## 2020-10-16 DIAGNOSIS — I119 Hypertensive heart disease without heart failure: Secondary | ICD-10-CM | POA: Diagnosis not present

## 2020-10-16 DIAGNOSIS — R7303 Prediabetes: Secondary | ICD-10-CM | POA: Diagnosis not present

## 2020-10-16 DIAGNOSIS — G629 Polyneuropathy, unspecified: Secondary | ICD-10-CM | POA: Diagnosis not present

## 2020-11-19 IMAGING — CT CT ANGIO CHEST
2 of 8 series · 18 of 36 positions shown · IV contrast (Omnipaque)
Comparison: Chest radiography same day

CLINICAL DATA: Shortness of breath and chills over the last month.
Possible coronavirus infection.

EXAM:
CT ANGIOGRAPHY CHEST WITH CONTRAST
TECHNIQUE: Multidetector CT imaging of the chest was performed using the
standard protocol during bolus administration of intravenous
contrast. Multiplanar CT image reconstructions and MIPs were
obtained to evaluate the vascular anatomy.
CONTRAST:  100mL OMNIPAQUE IOHEXOL 350 MG/ML SOLN

[Series 5: pe thins · axial · 0.66mm/px · z∈[-306,-69]mm · 17 of 265 slices shown]
[im 14/265  lung]
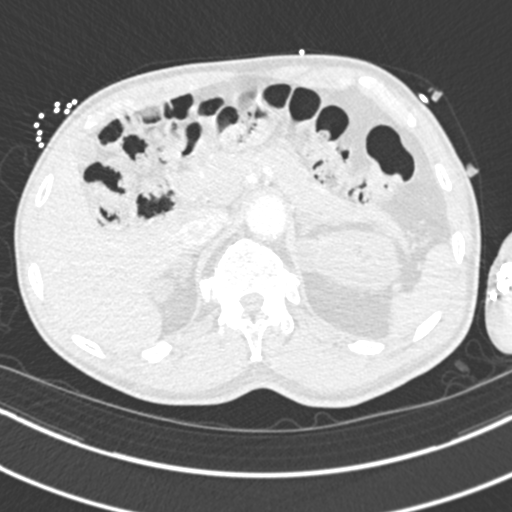
[im 28/265  mediastinal]
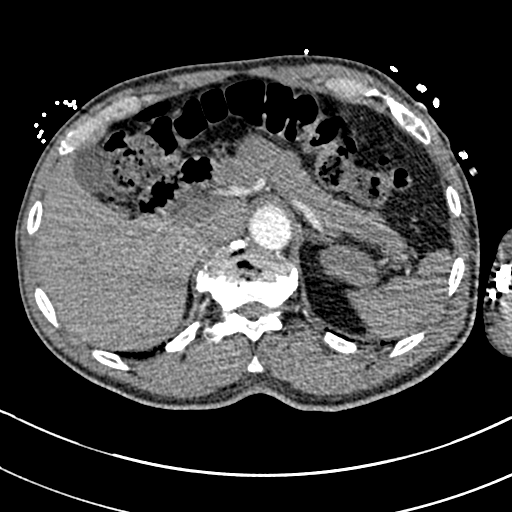
[im 42/265  lung]
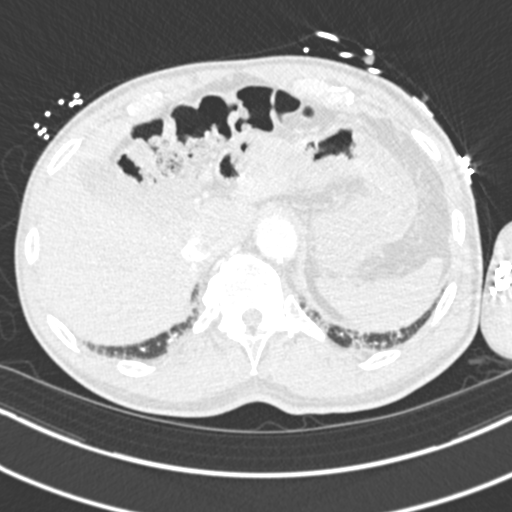
[im 56/265  mediastinal]
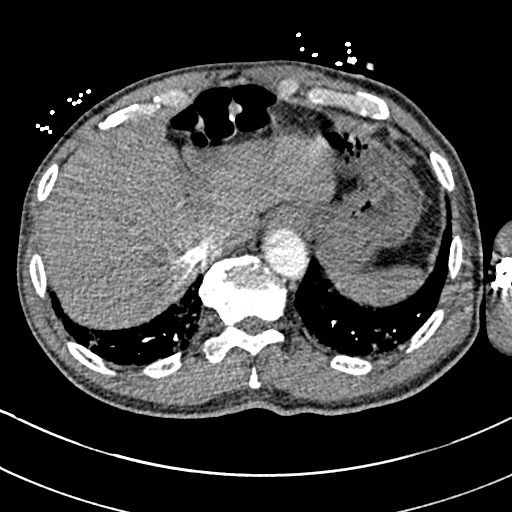
[im 70/265  lung]
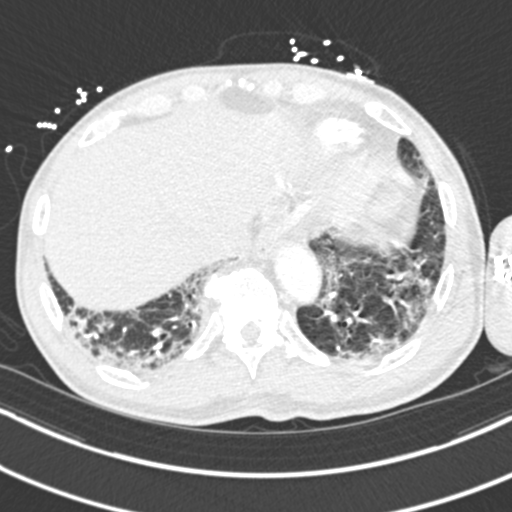
[im 84/265  mediastinal]
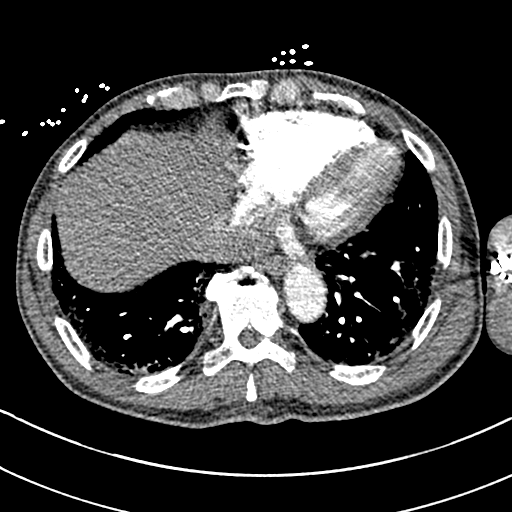
[im 98/265  lung]
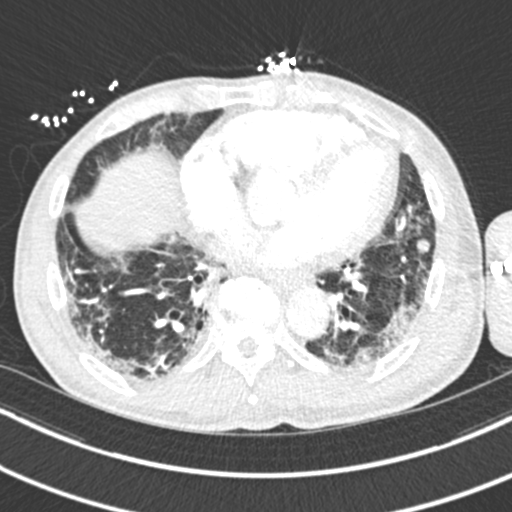
[im 112/265  mediastinal]
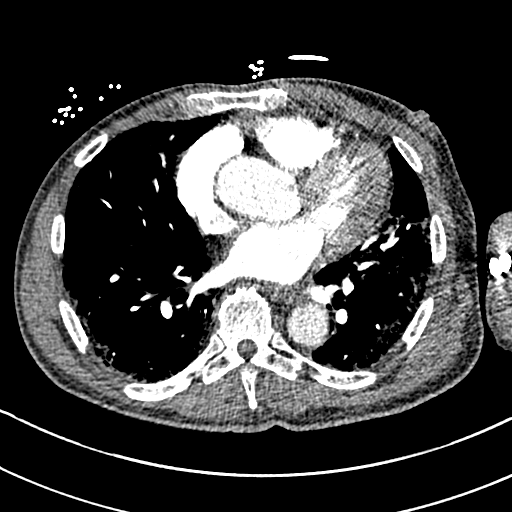
[im 139/265  lung]
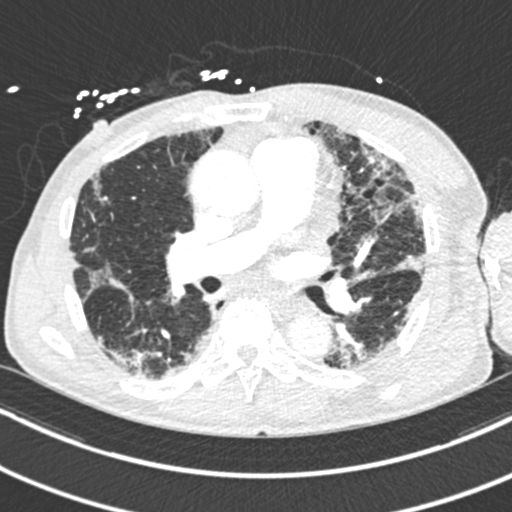
[im 153/265  mediastinal]
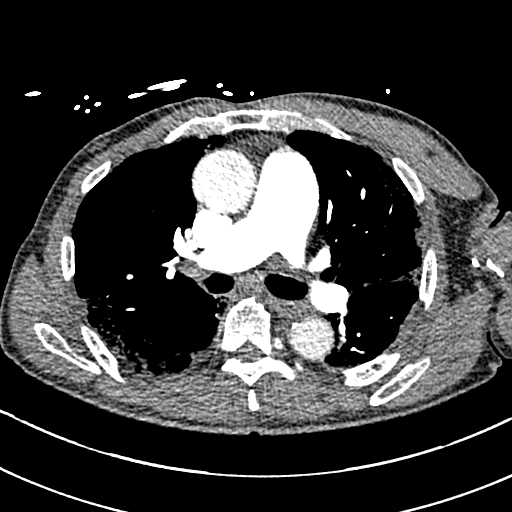
[im 167/265  lung]
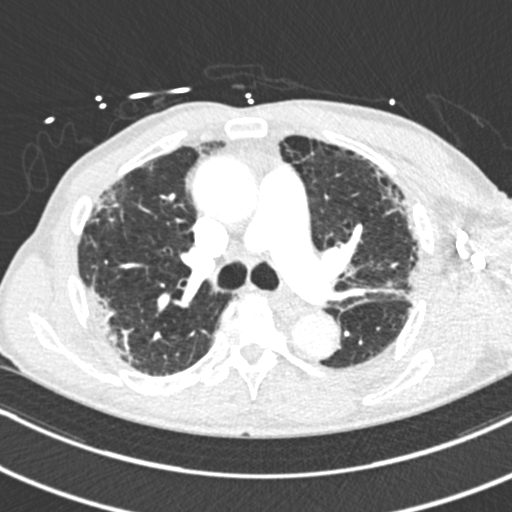
[im 181/265  mediastinal]
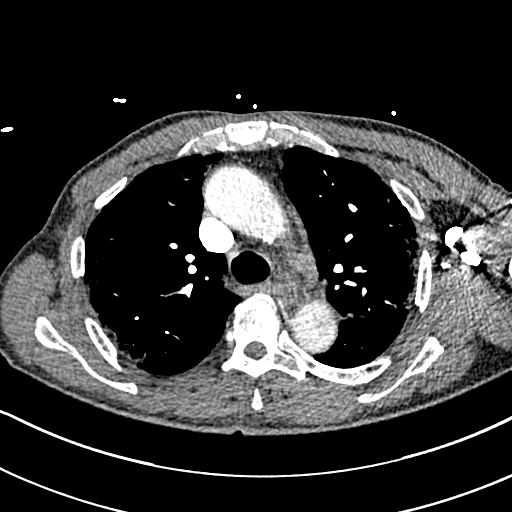
[im 195/265  lung]
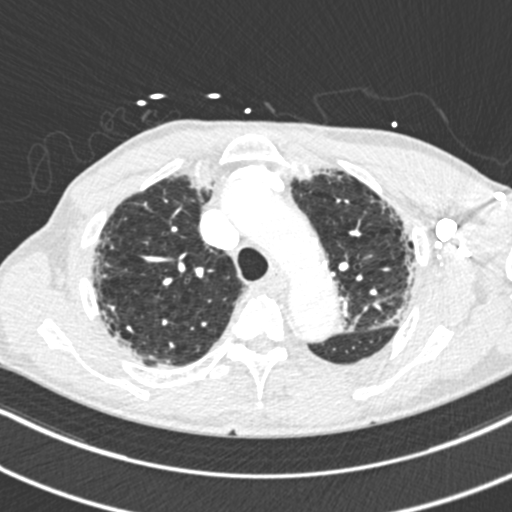
[im 209/265  mediastinal]
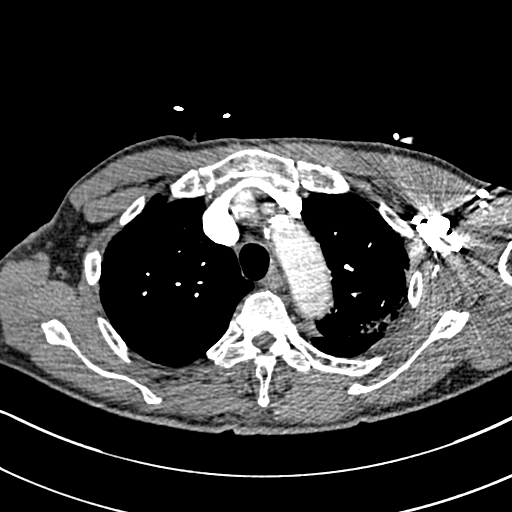
[im 223/265  lung]
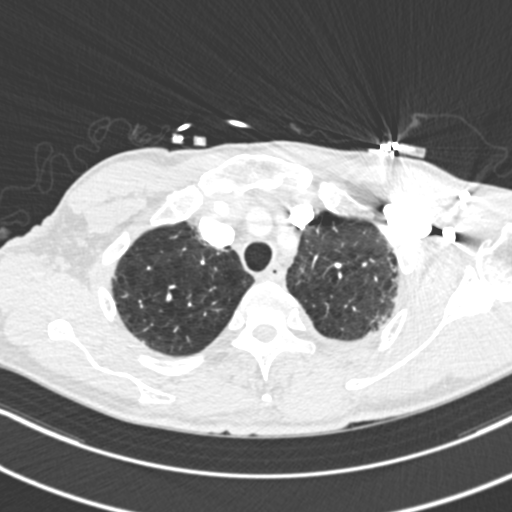
[im 237/265  mediastinal]
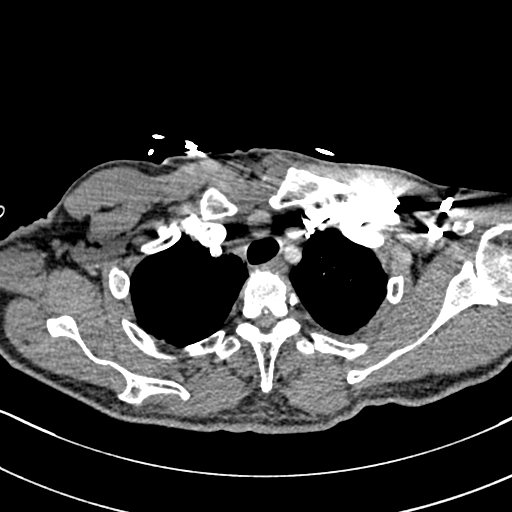
[im 251/265  lung]
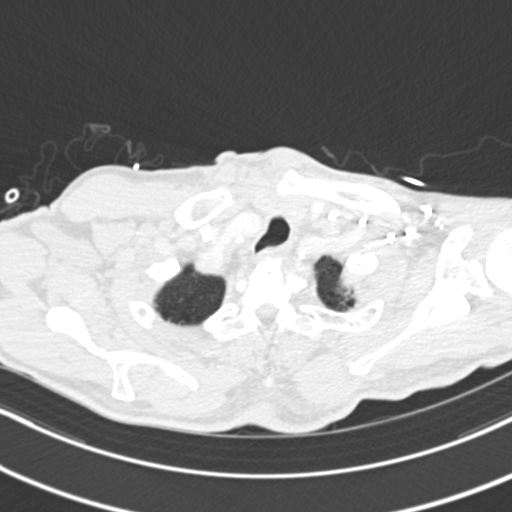

[Series 7: pe coronal mpr · coronal · 0.58mm/px · 1 of 127 slices shown]
[im 64/127  mediastinal]
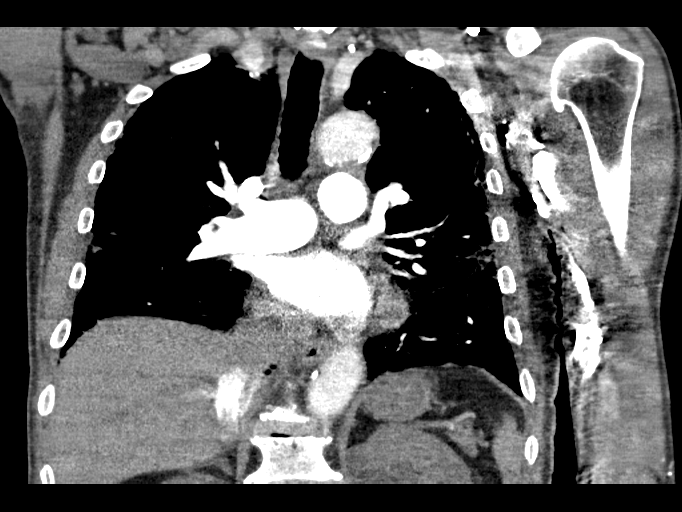

[18 of 36 positions shown; findings below may reference images not displayed]

FINDINGS: Cardiovascular: Pulmonary arterial opacification is good. There are
no pulmonary emboli. Heart size is normal. Coronary artery
calcification is present. There is aortic atherosclerosis without
evidence of aneurysm.

Mediastinum/Nodes: No mediastinal or hilar mass or lymphadenopathy.

Lungs/Pleura: Chronic lung disease with peripheral scarring and
emphysema. Superimposed upon that, there are areas of patchy
infiltrate possibly indicating viral pneumonia. No dense
consolidation or lobar collapse. Additionally, there is a 1 cm
rounded density in the lateral left lower lobe that will need
follow-up once the infection is cleared.

Upper Abdomen: Negative except for cysts of the left kidney.

Musculoskeletal: Ordinary thoracic degenerative changes.

Review of the MIP images confirms the above findings.
IMPRESSION: Chronic lung disease with scarring and emphysema. Multiple areas of
peripheral patchy infiltrate in both lungs which could go along with
viral pneumonia. No dense consolidation or lobar collapse.

1 cm rounded pulmonary density in the left lower lobe. This will
require follow-up after treatment of the pneumonia to rule out a
possible mass lesion.

No pulmonary emboli.

Aortic atherosclerosis.  Coronary artery calcification.

## 2020-11-19 IMAGING — DX DG CHEST 1V PORT
1 series · 1 of 1 positions shown · non-contrast
Comparison: Chest radiograph dated 07/10/2019.

CLINICAL DATA: 78-year-old male with shortness of breath and cough.

EXAM:
PORTABLE CHEST 1 VIEW

[chest ap]
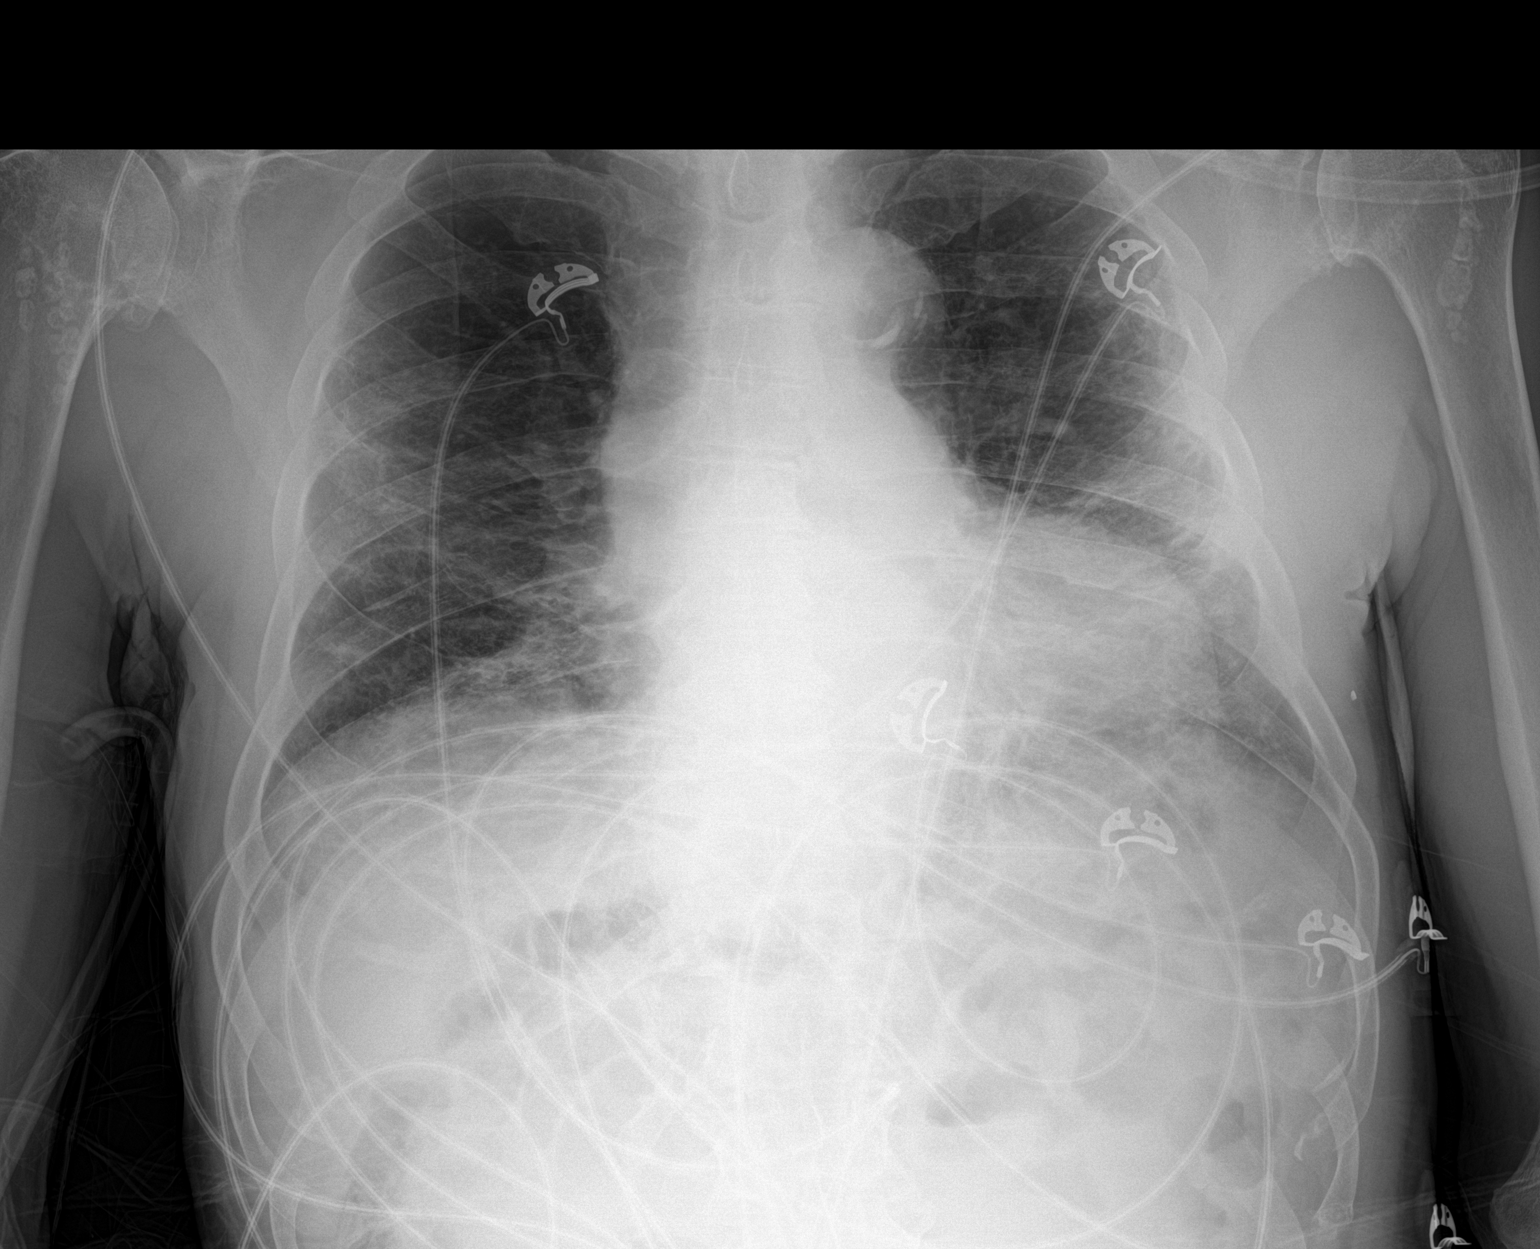

[1 of 1 positions shown; findings below may reference images not displayed]

FINDINGS: Bilateral patchy and streaky densities primarily involving the
peripheral and subpleural lungs similar or minimally increased since
the prior radiograph and most consistent with multifocal pneumonia,
possibly viral or atypical in etiology. Clinical correlation and
follow-up recommended. No large pleural effusion. There is no
pneumothorax. Stable cardiac silhouette. Atherosclerotic
calcification of the aorta. No acute osseous pathology.
IMPRESSION: Multifocal pneumonia. Clinical correlation and follow-up
recommended.

## 2021-02-02 DIAGNOSIS — Z131 Encounter for screening for diabetes mellitus: Secondary | ICD-10-CM | POA: Diagnosis not present

## 2021-02-02 DIAGNOSIS — Z0001 Encounter for general adult medical examination with abnormal findings: Secondary | ICD-10-CM | POA: Diagnosis not present

## 2021-02-02 DIAGNOSIS — Z136 Encounter for screening for cardiovascular disorders: Secondary | ICD-10-CM | POA: Diagnosis not present

## 2021-02-02 DIAGNOSIS — R7303 Prediabetes: Secondary | ICD-10-CM | POA: Diagnosis not present

## 2021-02-02 DIAGNOSIS — N1831 Chronic kidney disease, stage 3a: Secondary | ICD-10-CM | POA: Diagnosis not present

## 2021-02-02 DIAGNOSIS — E7211 Homocystinuria: Secondary | ICD-10-CM | POA: Diagnosis not present

## 2021-02-02 DIAGNOSIS — E559 Vitamin D deficiency, unspecified: Secondary | ICD-10-CM | POA: Diagnosis not present

## 2021-02-02 DIAGNOSIS — R1013 Epigastric pain: Secondary | ICD-10-CM | POA: Diagnosis not present

## 2021-02-02 DIAGNOSIS — E7800 Pure hypercholesterolemia, unspecified: Secondary | ICD-10-CM | POA: Diagnosis not present

## 2021-02-02 DIAGNOSIS — Z1329 Encounter for screening for other suspected endocrine disorder: Secondary | ICD-10-CM | POA: Diagnosis not present

## 2021-02-02 DIAGNOSIS — Z125 Encounter for screening for malignant neoplasm of prostate: Secondary | ICD-10-CM | POA: Diagnosis not present

## 2021-02-02 DIAGNOSIS — I10 Essential (primary) hypertension: Secondary | ICD-10-CM | POA: Diagnosis not present

## 2021-09-15 DIAGNOSIS — I10 Essential (primary) hypertension: Secondary | ICD-10-CM | POA: Diagnosis not present

## 2021-09-15 DIAGNOSIS — I119 Hypertensive heart disease without heart failure: Secondary | ICD-10-CM | POA: Diagnosis not present

## 2021-09-15 DIAGNOSIS — Z0001 Encounter for general adult medical examination with abnormal findings: Secondary | ICD-10-CM | POA: Diagnosis not present

## 2021-09-15 DIAGNOSIS — R7303 Prediabetes: Secondary | ICD-10-CM | POA: Diagnosis not present

## 2021-09-15 DIAGNOSIS — E7800 Pure hypercholesterolemia, unspecified: Secondary | ICD-10-CM | POA: Diagnosis not present

## 2021-09-15 DIAGNOSIS — E559 Vitamin D deficiency, unspecified: Secondary | ICD-10-CM | POA: Diagnosis not present

## 2021-09-15 DIAGNOSIS — E7211 Homocystinuria: Secondary | ICD-10-CM | POA: Diagnosis not present

## 2021-09-15 DIAGNOSIS — R1013 Epigastric pain: Secondary | ICD-10-CM | POA: Diagnosis not present

## 2021-09-15 DIAGNOSIS — N1831 Chronic kidney disease, stage 3a: Secondary | ICD-10-CM | POA: Diagnosis not present

## 2021-09-15 DIAGNOSIS — Z720 Tobacco use: Secondary | ICD-10-CM | POA: Diagnosis not present

## 2021-09-21 DIAGNOSIS — M25552 Pain in left hip: Secondary | ICD-10-CM | POA: Diagnosis not present

## 2021-11-03 DIAGNOSIS — G629 Polyneuropathy, unspecified: Secondary | ICD-10-CM | POA: Diagnosis not present

## 2021-11-03 DIAGNOSIS — I10 Essential (primary) hypertension: Secondary | ICD-10-CM | POA: Diagnosis not present

## 2021-11-03 DIAGNOSIS — E7800 Pure hypercholesterolemia, unspecified: Secondary | ICD-10-CM | POA: Diagnosis not present

## 2021-11-03 DIAGNOSIS — R7303 Prediabetes: Secondary | ICD-10-CM | POA: Diagnosis not present

## 2021-11-03 DIAGNOSIS — N1831 Chronic kidney disease, stage 3a: Secondary | ICD-10-CM | POA: Diagnosis not present

## 2021-12-15 DIAGNOSIS — Z136 Encounter for screening for cardiovascular disorders: Secondary | ICD-10-CM | POA: Diagnosis not present

## 2021-12-15 DIAGNOSIS — R7303 Prediabetes: Secondary | ICD-10-CM | POA: Diagnosis not present

## 2021-12-15 DIAGNOSIS — E559 Vitamin D deficiency, unspecified: Secondary | ICD-10-CM | POA: Diagnosis not present

## 2021-12-15 DIAGNOSIS — Z1329 Encounter for screening for other suspected endocrine disorder: Secondary | ICD-10-CM | POA: Diagnosis not present

## 2021-12-15 DIAGNOSIS — Z0001 Encounter for general adult medical examination with abnormal findings: Secondary | ICD-10-CM | POA: Diagnosis not present

## 2021-12-15 DIAGNOSIS — Z131 Encounter for screening for diabetes mellitus: Secondary | ICD-10-CM | POA: Diagnosis not present

## 2021-12-15 DIAGNOSIS — J209 Acute bronchitis, unspecified: Secondary | ICD-10-CM | POA: Diagnosis not present

## 2021-12-15 DIAGNOSIS — Z125 Encounter for screening for malignant neoplasm of prostate: Secondary | ICD-10-CM | POA: Diagnosis not present

## 2021-12-15 DIAGNOSIS — E7800 Pure hypercholesterolemia, unspecified: Secondary | ICD-10-CM | POA: Diagnosis not present

## 2022-01-04 DIAGNOSIS — E559 Vitamin D deficiency, unspecified: Secondary | ICD-10-CM | POA: Diagnosis not present

## 2022-01-04 DIAGNOSIS — N1831 Chronic kidney disease, stage 3a: Secondary | ICD-10-CM | POA: Diagnosis not present

## 2022-01-04 DIAGNOSIS — E7800 Pure hypercholesterolemia, unspecified: Secondary | ICD-10-CM | POA: Diagnosis not present

## 2022-01-04 DIAGNOSIS — I10 Essential (primary) hypertension: Secondary | ICD-10-CM | POA: Diagnosis not present

## 2022-01-04 DIAGNOSIS — R7303 Prediabetes: Secondary | ICD-10-CM | POA: Diagnosis not present

## 2022-01-13 DIAGNOSIS — I129 Hypertensive chronic kidney disease with stage 1 through stage 4 chronic kidney disease, or unspecified chronic kidney disease: Secondary | ICD-10-CM | POA: Diagnosis not present

## 2022-01-13 DIAGNOSIS — N183 Chronic kidney disease, stage 3 unspecified: Secondary | ICD-10-CM | POA: Diagnosis not present

## 2022-02-22 DIAGNOSIS — R7303 Prediabetes: Secondary | ICD-10-CM | POA: Diagnosis not present

## 2022-02-22 DIAGNOSIS — I10 Essential (primary) hypertension: Secondary | ICD-10-CM | POA: Diagnosis not present

## 2022-02-22 DIAGNOSIS — E7800 Pure hypercholesterolemia, unspecified: Secondary | ICD-10-CM | POA: Diagnosis not present

## 2022-02-22 DIAGNOSIS — E559 Vitamin D deficiency, unspecified: Secondary | ICD-10-CM | POA: Diagnosis not present

## 2022-02-22 DIAGNOSIS — N1831 Chronic kidney disease, stage 3a: Secondary | ICD-10-CM | POA: Diagnosis not present

## 2022-09-16 DIAGNOSIS — E785 Hyperlipidemia, unspecified: Secondary | ICD-10-CM | POA: Diagnosis not present

## 2022-09-16 DIAGNOSIS — E559 Vitamin D deficiency, unspecified: Secondary | ICD-10-CM | POA: Diagnosis not present

## 2022-09-16 DIAGNOSIS — N1831 Chronic kidney disease, stage 3a: Secondary | ICD-10-CM | POA: Diagnosis not present

## 2022-09-16 DIAGNOSIS — E7800 Pure hypercholesterolemia, unspecified: Secondary | ICD-10-CM | POA: Diagnosis not present

## 2022-09-16 DIAGNOSIS — Z0001 Encounter for general adult medical examination with abnormal findings: Secondary | ICD-10-CM | POA: Diagnosis not present

## 2022-09-16 DIAGNOSIS — I10 Essential (primary) hypertension: Secondary | ICD-10-CM | POA: Diagnosis not present

## 2022-09-16 DIAGNOSIS — R739 Hyperglycemia, unspecified: Secondary | ICD-10-CM | POA: Diagnosis not present

## 2022-09-16 DIAGNOSIS — R7303 Prediabetes: Secondary | ICD-10-CM | POA: Diagnosis not present

## 2022-09-21 DIAGNOSIS — H2511 Age-related nuclear cataract, right eye: Secondary | ICD-10-CM | POA: Diagnosis not present

## 2022-09-30 DIAGNOSIS — N1831 Chronic kidney disease, stage 3a: Secondary | ICD-10-CM | POA: Diagnosis not present

## 2022-09-30 DIAGNOSIS — R7303 Prediabetes: Secondary | ICD-10-CM | POA: Diagnosis not present

## 2022-09-30 DIAGNOSIS — E7800 Pure hypercholesterolemia, unspecified: Secondary | ICD-10-CM | POA: Diagnosis not present

## 2022-09-30 DIAGNOSIS — E559 Vitamin D deficiency, unspecified: Secondary | ICD-10-CM | POA: Diagnosis not present

## 2022-09-30 DIAGNOSIS — I10 Essential (primary) hypertension: Secondary | ICD-10-CM | POA: Diagnosis not present

## 2022-10-13 DIAGNOSIS — H2511 Age-related nuclear cataract, right eye: Secondary | ICD-10-CM | POA: Diagnosis not present

## 2022-11-30 DIAGNOSIS — H2511 Age-related nuclear cataract, right eye: Secondary | ICD-10-CM | POA: Diagnosis not present

## 2022-11-30 DIAGNOSIS — N183 Chronic kidney disease, stage 3 unspecified: Secondary | ICD-10-CM | POA: Diagnosis not present

## 2022-11-30 DIAGNOSIS — K219 Gastro-esophageal reflux disease without esophagitis: Secondary | ICD-10-CM | POA: Diagnosis not present

## 2022-11-30 DIAGNOSIS — I129 Hypertensive chronic kidney disease with stage 1 through stage 4 chronic kidney disease, or unspecified chronic kidney disease: Secondary | ICD-10-CM | POA: Diagnosis not present

## 2023-01-27 DIAGNOSIS — E7800 Pure hypercholesterolemia, unspecified: Secondary | ICD-10-CM | POA: Diagnosis not present

## 2023-01-27 DIAGNOSIS — G629 Polyneuropathy, unspecified: Secondary | ICD-10-CM | POA: Diagnosis not present

## 2023-01-27 DIAGNOSIS — Z0001 Encounter for general adult medical examination with abnormal findings: Secondary | ICD-10-CM | POA: Diagnosis not present

## 2023-01-27 DIAGNOSIS — E559 Vitamin D deficiency, unspecified: Secondary | ICD-10-CM | POA: Diagnosis not present

## 2023-01-27 DIAGNOSIS — R7303 Prediabetes: Secondary | ICD-10-CM | POA: Diagnosis not present

## 2023-01-27 DIAGNOSIS — N1831 Chronic kidney disease, stage 3a: Secondary | ICD-10-CM | POA: Diagnosis not present

## 2023-01-27 DIAGNOSIS — I10 Essential (primary) hypertension: Secondary | ICD-10-CM | POA: Diagnosis not present

## 2023-01-27 DIAGNOSIS — Z125 Encounter for screening for malignant neoplasm of prostate: Secondary | ICD-10-CM | POA: Diagnosis not present

## 2023-02-07 DIAGNOSIS — N183 Chronic kidney disease, stage 3 unspecified: Secondary | ICD-10-CM | POA: Diagnosis not present

## 2023-02-07 DIAGNOSIS — I129 Hypertensive chronic kidney disease with stage 1 through stage 4 chronic kidney disease, or unspecified chronic kidney disease: Secondary | ICD-10-CM | POA: Diagnosis not present

## 2023-02-10 DIAGNOSIS — I10 Essential (primary) hypertension: Secondary | ICD-10-CM | POA: Diagnosis not present

## 2023-02-10 DIAGNOSIS — G629 Polyneuropathy, unspecified: Secondary | ICD-10-CM | POA: Diagnosis not present

## 2023-02-10 DIAGNOSIS — N1831 Chronic kidney disease, stage 3a: Secondary | ICD-10-CM | POA: Diagnosis not present

## 2023-02-10 DIAGNOSIS — R7303 Prediabetes: Secondary | ICD-10-CM | POA: Diagnosis not present

## 2023-02-10 DIAGNOSIS — E559 Vitamin D deficiency, unspecified: Secondary | ICD-10-CM | POA: Diagnosis not present

## 2023-02-10 DIAGNOSIS — E7800 Pure hypercholesterolemia, unspecified: Secondary | ICD-10-CM | POA: Diagnosis not present

## 2023-05-02 DIAGNOSIS — R7303 Prediabetes: Secondary | ICD-10-CM | POA: Diagnosis not present

## 2023-05-02 DIAGNOSIS — E7800 Pure hypercholesterolemia, unspecified: Secondary | ICD-10-CM | POA: Diagnosis not present

## 2023-05-02 DIAGNOSIS — G629 Polyneuropathy, unspecified: Secondary | ICD-10-CM | POA: Diagnosis not present

## 2023-05-02 DIAGNOSIS — I10 Essential (primary) hypertension: Secondary | ICD-10-CM | POA: Diagnosis not present

## 2023-05-02 DIAGNOSIS — E559 Vitamin D deficiency, unspecified: Secondary | ICD-10-CM | POA: Diagnosis not present

## 2023-05-02 DIAGNOSIS — Z23 Encounter for immunization: Secondary | ICD-10-CM | POA: Diagnosis not present

## 2023-05-02 DIAGNOSIS — R252 Cramp and spasm: Secondary | ICD-10-CM | POA: Diagnosis not present

## 2023-05-02 DIAGNOSIS — N1831 Chronic kidney disease, stage 3a: Secondary | ICD-10-CM | POA: Diagnosis not present

## 2023-05-23 DIAGNOSIS — E559 Vitamin D deficiency, unspecified: Secondary | ICD-10-CM | POA: Diagnosis not present

## 2023-05-23 DIAGNOSIS — I1310 Hypertensive heart and chronic kidney disease without heart failure, with stage 1 through stage 4 chronic kidney disease, or unspecified chronic kidney disease: Secondary | ICD-10-CM | POA: Diagnosis not present

## 2023-05-23 DIAGNOSIS — R7303 Prediabetes: Secondary | ICD-10-CM | POA: Diagnosis not present

## 2023-05-23 DIAGNOSIS — G629 Polyneuropathy, unspecified: Secondary | ICD-10-CM | POA: Diagnosis not present

## 2023-05-23 DIAGNOSIS — E7800 Pure hypercholesterolemia, unspecified: Secondary | ICD-10-CM | POA: Diagnosis not present

## 2023-05-23 DIAGNOSIS — N1831 Chronic kidney disease, stage 3a: Secondary | ICD-10-CM | POA: Diagnosis not present

## 2023-12-29 DIAGNOSIS — N1831 Chronic kidney disease, stage 3a: Secondary | ICD-10-CM | POA: Diagnosis not present

## 2023-12-29 DIAGNOSIS — R7303 Prediabetes: Secondary | ICD-10-CM | POA: Diagnosis not present

## 2023-12-29 DIAGNOSIS — Z131 Encounter for screening for diabetes mellitus: Secondary | ICD-10-CM | POA: Diagnosis not present

## 2023-12-29 DIAGNOSIS — I1310 Hypertensive heart and chronic kidney disease without heart failure, with stage 1 through stage 4 chronic kidney disease, or unspecified chronic kidney disease: Secondary | ICD-10-CM | POA: Diagnosis not present

## 2023-12-29 DIAGNOSIS — E559 Vitamin D deficiency, unspecified: Secondary | ICD-10-CM | POA: Diagnosis not present

## 2023-12-29 DIAGNOSIS — Z125 Encounter for screening for malignant neoplasm of prostate: Secondary | ICD-10-CM | POA: Diagnosis not present

## 2023-12-29 DIAGNOSIS — E7800 Pure hypercholesterolemia, unspecified: Secondary | ICD-10-CM | POA: Diagnosis not present

## 2023-12-29 DIAGNOSIS — G629 Polyneuropathy, unspecified: Secondary | ICD-10-CM | POA: Diagnosis not present

## 2024-01-16 DIAGNOSIS — Z125 Encounter for screening for malignant neoplasm of prostate: Secondary | ICD-10-CM | POA: Diagnosis not present

## 2024-01-16 DIAGNOSIS — E559 Vitamin D deficiency, unspecified: Secondary | ICD-10-CM | POA: Diagnosis not present

## 2024-01-16 DIAGNOSIS — G629 Polyneuropathy, unspecified: Secondary | ICD-10-CM | POA: Diagnosis not present

## 2024-01-16 DIAGNOSIS — R7303 Prediabetes: Secondary | ICD-10-CM | POA: Diagnosis not present

## 2024-01-16 DIAGNOSIS — I1310 Hypertensive heart and chronic kidney disease without heart failure, with stage 1 through stage 4 chronic kidney disease, or unspecified chronic kidney disease: Secondary | ICD-10-CM | POA: Diagnosis not present

## 2024-01-16 DIAGNOSIS — N1831 Chronic kidney disease, stage 3a: Secondary | ICD-10-CM | POA: Diagnosis not present

## 2024-01-16 DIAGNOSIS — E7800 Pure hypercholesterolemia, unspecified: Secondary | ICD-10-CM | POA: Diagnosis not present

## 2024-03-19 DIAGNOSIS — H26493 Other secondary cataract, bilateral: Secondary | ICD-10-CM | POA: Diagnosis not present

## 2024-03-19 DIAGNOSIS — H524 Presbyopia: Secondary | ICD-10-CM | POA: Diagnosis not present

## 2024-03-19 DIAGNOSIS — Z135 Encounter for screening for eye and ear disorders: Secondary | ICD-10-CM | POA: Diagnosis not present

## 2024-03-19 DIAGNOSIS — H31002 Unspecified chorioretinal scars, left eye: Secondary | ICD-10-CM | POA: Diagnosis not present

## 2024-04-23 DIAGNOSIS — E7800 Pure hypercholesterolemia, unspecified: Secondary | ICD-10-CM | POA: Diagnosis not present

## 2024-04-23 DIAGNOSIS — Z23 Encounter for immunization: Secondary | ICD-10-CM | POA: Diagnosis not present

## 2024-04-23 DIAGNOSIS — I1310 Hypertensive heart and chronic kidney disease without heart failure, with stage 1 through stage 4 chronic kidney disease, or unspecified chronic kidney disease: Secondary | ICD-10-CM | POA: Diagnosis not present

## 2024-04-23 DIAGNOSIS — E559 Vitamin D deficiency, unspecified: Secondary | ICD-10-CM | POA: Diagnosis not present

## 2024-04-23 DIAGNOSIS — N1831 Chronic kidney disease, stage 3a: Secondary | ICD-10-CM | POA: Diagnosis not present

## 2024-04-23 DIAGNOSIS — R7303 Prediabetes: Secondary | ICD-10-CM | POA: Diagnosis not present

## 2024-05-29 DIAGNOSIS — Z8249 Family history of ischemic heart disease and other diseases of the circulatory system: Secondary | ICD-10-CM | POA: Diagnosis not present

## 2024-05-29 DIAGNOSIS — I129 Hypertensive chronic kidney disease with stage 1 through stage 4 chronic kidney disease, or unspecified chronic kidney disease: Secondary | ICD-10-CM | POA: Diagnosis not present

## 2024-05-29 DIAGNOSIS — Z87891 Personal history of nicotine dependence: Secondary | ICD-10-CM | POA: Diagnosis not present

## 2024-05-29 DIAGNOSIS — E785 Hyperlipidemia, unspecified: Secondary | ICD-10-CM | POA: Diagnosis not present

## 2024-05-29 DIAGNOSIS — G629 Polyneuropathy, unspecified: Secondary | ICD-10-CM | POA: Diagnosis not present

## 2024-05-29 DIAGNOSIS — N189 Chronic kidney disease, unspecified: Secondary | ICD-10-CM | POA: Diagnosis not present
